# Patient Record
Sex: Female | Born: 1940 | Race: Black or African American | Hispanic: No | Smoking: Former smoker
Health system: Southern US, Community
[De-identification: ages and names within clinical notes are randomized; demographics above are authoritative.]

## PROBLEM LIST (undated history)

## (undated) ENCOUNTER — Ambulatory Visit: Source: Home / Self Care

## (undated) DIAGNOSIS — M5481 Occipital neuralgia: Secondary | ICD-10-CM

## (undated) DIAGNOSIS — D259 Leiomyoma of uterus, unspecified: Secondary | ICD-10-CM

## (undated) DIAGNOSIS — M1990 Unspecified osteoarthritis, unspecified site: Secondary | ICD-10-CM

## (undated) DIAGNOSIS — M8580 Other specified disorders of bone density and structure, unspecified site: Secondary | ICD-10-CM

## (undated) DIAGNOSIS — I10 Essential (primary) hypertension: Secondary | ICD-10-CM

## (undated) HISTORY — DX: Essential (primary) hypertension: I10

## (undated) HISTORY — DX: Unspecified osteoarthritis, unspecified site: M19.90

## (undated) HISTORY — PX: FOOT SURGERY: SHX648

## (undated) HISTORY — DX: Occipital neuralgia: M54.81

## (undated) HISTORY — DX: Leiomyoma of uterus, unspecified: D25.9

## (undated) HISTORY — PX: TUBAL LIGATION: SHX77

## (undated) HISTORY — PX: CATARACT EXTRACTION: SUR2

---
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
## 1997-04-05 ENCOUNTER — Other Ambulatory Visit: Admission: RE | Admit: 1997-04-05 | Discharge: 1997-04-05 | Payer: Self-pay | Admitting: *Deleted

## 1997-04-05 ENCOUNTER — Encounter: Admission: RE | Admit: 1997-04-05 | Discharge: 1997-07-04 | Payer: Self-pay | Admitting: *Deleted

## 1997-04-20 ENCOUNTER — Ambulatory Visit (HOSPITAL_COMMUNITY): Admission: RE | Admit: 1997-04-20 | Discharge: 1997-04-20 | Payer: Self-pay | Admitting: *Deleted

## 1997-06-11 ENCOUNTER — Other Ambulatory Visit: Admission: RE | Admit: 1997-06-11 | Discharge: 1997-06-11 | Payer: Self-pay | Admitting: Gynecology

## 1997-06-14 ENCOUNTER — Ambulatory Visit (HOSPITAL_COMMUNITY): Admission: RE | Admit: 1997-06-14 | Discharge: 1997-06-14 | Payer: Self-pay | Admitting: Obstetrics and Gynecology

## 1999-04-29 ENCOUNTER — Ambulatory Visit (HOSPITAL_COMMUNITY): Admission: RE | Admit: 1999-04-29 | Discharge: 1999-04-29 | Payer: Self-pay | Admitting: Obstetrics and Gynecology

## 1999-04-29 ENCOUNTER — Encounter: Payer: Self-pay | Admitting: Obstetrics and Gynecology

## 1999-11-28 ENCOUNTER — Emergency Department (HOSPITAL_COMMUNITY): Admission: EM | Admit: 1999-11-28 | Discharge: 1999-11-28 | Payer: Self-pay | Admitting: Emergency Medicine

## 1999-11-28 ENCOUNTER — Encounter: Payer: Self-pay | Admitting: Emergency Medicine

## 1999-12-02 ENCOUNTER — Ambulatory Visit (HOSPITAL_COMMUNITY): Admission: RE | Admit: 1999-12-02 | Discharge: 1999-12-02 | Payer: Self-pay | Admitting: Cardiology

## 1999-12-02 ENCOUNTER — Encounter: Payer: Self-pay | Admitting: Cardiology

## 2001-01-24 ENCOUNTER — Other Ambulatory Visit: Admission: RE | Admit: 2001-01-24 | Discharge: 2001-01-24 | Payer: Self-pay | Admitting: Obstetrics and Gynecology

## 2001-01-26 ENCOUNTER — Encounter: Admission: RE | Admit: 2001-01-26 | Discharge: 2001-01-26 | Payer: Self-pay | Admitting: Obstetrics and Gynecology

## 2001-01-26 ENCOUNTER — Encounter: Payer: Self-pay | Admitting: Obstetrics and Gynecology

## 2001-02-01 ENCOUNTER — Encounter: Admission: RE | Admit: 2001-02-01 | Discharge: 2001-02-01 | Payer: Self-pay | Admitting: Obstetrics and Gynecology

## 2001-02-01 ENCOUNTER — Encounter: Payer: Self-pay | Admitting: Obstetrics and Gynecology

## 2001-06-05 ENCOUNTER — Emergency Department (HOSPITAL_COMMUNITY): Admission: EM | Admit: 2001-06-05 | Discharge: 2001-06-05 | Payer: Self-pay | Admitting: Emergency Medicine

## 2002-08-08 ENCOUNTER — Emergency Department (HOSPITAL_COMMUNITY): Admission: EM | Admit: 2002-08-08 | Discharge: 2002-08-08 | Payer: Self-pay | Admitting: *Deleted

## 2002-08-08 ENCOUNTER — Encounter: Payer: Self-pay | Admitting: *Deleted

## 2002-08-23 ENCOUNTER — Encounter: Payer: Self-pay | Admitting: Obstetrics and Gynecology

## 2002-08-23 ENCOUNTER — Ambulatory Visit (HOSPITAL_COMMUNITY): Admission: RE | Admit: 2002-08-23 | Discharge: 2002-08-23 | Payer: Self-pay | Admitting: Obstetrics and Gynecology

## 2002-09-19 ENCOUNTER — Other Ambulatory Visit: Admission: RE | Admit: 2002-09-19 | Discharge: 2002-09-19 | Payer: Self-pay | Admitting: Obstetrics and Gynecology

## 2003-03-10 ENCOUNTER — Emergency Department (HOSPITAL_COMMUNITY): Admission: EM | Admit: 2003-03-10 | Discharge: 2003-03-10 | Payer: Self-pay | Admitting: Emergency Medicine

## 2004-08-26 ENCOUNTER — Ambulatory Visit (HOSPITAL_COMMUNITY): Admission: RE | Admit: 2004-08-26 | Discharge: 2004-08-26 | Payer: Self-pay | Admitting: Obstetrics and Gynecology

## 2004-09-11 ENCOUNTER — Other Ambulatory Visit: Admission: RE | Admit: 2004-09-11 | Discharge: 2004-09-11 | Payer: Self-pay | Admitting: Obstetrics and Gynecology

## 2004-09-27 ENCOUNTER — Emergency Department (HOSPITAL_COMMUNITY): Admission: EM | Admit: 2004-09-27 | Discharge: 2004-09-27 | Payer: Self-pay | Admitting: Emergency Medicine

## 2005-05-24 ENCOUNTER — Emergency Department (HOSPITAL_COMMUNITY): Admission: EM | Admit: 2005-05-24 | Discharge: 2005-05-24 | Payer: Self-pay | Admitting: Family Medicine

## 2005-11-25 ENCOUNTER — Ambulatory Visit (HOSPITAL_COMMUNITY): Admission: RE | Admit: 2005-11-25 | Discharge: 2005-11-25 | Payer: Self-pay | Admitting: Obstetrics and Gynecology

## 2006-01-12 ENCOUNTER — Other Ambulatory Visit: Admission: RE | Admit: 2006-01-12 | Discharge: 2006-01-12 | Payer: Self-pay | Admitting: Obstetrics and Gynecology

## 2007-09-28 ENCOUNTER — Emergency Department (HOSPITAL_COMMUNITY): Admission: EM | Admit: 2007-09-28 | Discharge: 2007-09-28 | Payer: Self-pay | Admitting: Emergency Medicine

## 2008-05-11 ENCOUNTER — Emergency Department (HOSPITAL_COMMUNITY): Admission: EM | Admit: 2008-05-11 | Discharge: 2008-05-11 | Payer: Self-pay | Admitting: Family Medicine

## 2008-06-19 ENCOUNTER — Encounter: Payer: Self-pay | Admitting: Obstetrics and Gynecology

## 2008-06-19 ENCOUNTER — Ambulatory Visit: Payer: Self-pay | Admitting: Obstetrics and Gynecology

## 2008-06-19 ENCOUNTER — Other Ambulatory Visit: Admission: RE | Admit: 2008-06-19 | Discharge: 2008-06-19 | Payer: Self-pay | Admitting: Obstetrics and Gynecology

## 2009-05-02 ENCOUNTER — Ambulatory Visit: Payer: Self-pay | Admitting: Obstetrics and Gynecology

## 2009-05-03 ENCOUNTER — Ambulatory Visit: Payer: Self-pay | Admitting: Obstetrics and Gynecology

## 2009-08-01 ENCOUNTER — Ambulatory Visit (HOSPITAL_COMMUNITY): Admission: RE | Admit: 2009-08-01 | Discharge: 2009-08-01 | Payer: Self-pay | Admitting: Obstetrics and Gynecology

## 2010-04-15 LAB — POCT URINALYSIS DIP (DEVICE)
Nitrite: NEGATIVE
pH: 5.5 (ref 5.0–8.0)

## 2012-11-28 ENCOUNTER — Encounter: Payer: Self-pay | Admitting: Gynecology

## 2012-11-28 ENCOUNTER — Ambulatory Visit (INDEPENDENT_AMBULATORY_CARE_PROVIDER_SITE_OTHER): Payer: Medicare Other | Admitting: Gynecology

## 2012-11-28 VITALS — BP 120/74 | Ht 61.5 in | Wt 176.4 lb

## 2012-11-28 DIAGNOSIS — M8580 Other specified disorders of bone density and structure, unspecified site: Secondary | ICD-10-CM

## 2012-11-28 DIAGNOSIS — N951 Menopausal and female climacteric states: Secondary | ICD-10-CM

## 2012-11-28 DIAGNOSIS — Z780 Asymptomatic menopausal state: Secondary | ICD-10-CM | POA: Insufficient documentation

## 2012-11-28 DIAGNOSIS — N952 Postmenopausal atrophic vaginitis: Secondary | ICD-10-CM | POA: Insufficient documentation

## 2012-11-28 DIAGNOSIS — D259 Leiomyoma of uterus, unspecified: Secondary | ICD-10-CM

## 2012-11-28 DIAGNOSIS — M899 Disorder of bone, unspecified: Secondary | ICD-10-CM

## 2012-11-28 DIAGNOSIS — D219 Benign neoplasm of connective and other soft tissue, unspecified: Secondary | ICD-10-CM

## 2012-11-28 DIAGNOSIS — Z8639 Personal history of other endocrine, nutritional and metabolic disease: Secondary | ICD-10-CM | POA: Insufficient documentation

## 2012-11-28 NOTE — Progress Notes (Addendum)
Miranda Gonzalez [birth year] [id number]   History:    72 y.o. who has not been seen in the office since 2010. Patient with history of fibroid uterus. Last ultrasound 2011 indicated that she had 5 fibroids the largest one measuring 56 x 46 x 49 cm. Patient is sexually active. Has been off hormone replacement therapy for many years. Has not had a colonoscopy yet. Cannot take a flu vaccine because she is allergic to egg white. Patient with known history of osteopenia several years ago had been placed on  Fosomax  but only took it for 6 months. Patient's last bone density study in 2006 demonstrated her lowest T score with -2.1 at the left femoral neck. Last mammogram 2011. Patient with no prior history of abnormal Pap smear. In her 68s she had a history of lichen sclerosis but is asymptomatic today. Her PCP has been treating her for hypertension Past medical history,surgical history, family history and social history were all reviewed and documented in the EPIC chart.  Gynecologic History No LMP recorded. Patient is postmenopausal. Contraception: post menopausal status Last Pap: 2010. Results were: normal Last mammogram: 2011. Results were: normal  Obstetric History OB History  Gravida Para Term Preterm AB SAB TAB Ectopic Multiple Living  4 3   1 1    3     # Outcome Date GA Lbr Len/2nd Weight Sex Delivery Anes PTL Lv  4 SAB           3 PAR           2 PAR           1 PAR                ROS: A ROS was performed and pertinent positives and negatives are included in the history.  GENERAL: No fevers or chills. HEENT: No change in vision, no earache, sore throat or sinus congestion. NECK: No pain or stiffness. CARDIOVASCULAR: No chest pain or pressure. No palpitations. PULMONARY: No shortness of breath, cough or wheeze. GASTROINTESTINAL: No abdominal pain, nausea, vomiting or diarrhea, melena or bright red blood per rectum. GENITOURINARY: No urinary frequency, urgency, hesitancy or dysuria.  MUSCULOSKELETAL: No joint or muscle pain, no back pain, no recent trauma. DERMATOLOGIC: No rash, no itching, no lesions. ENDOCRINE: No polyuria, polydipsia, no heat or cold intolerance. No recent change in weight. HEMATOLOGICAL: No anemia or easy bruising or bleeding. NEUROLOGIC: No headache, seizures, numbness, tingling or weakness. PSYCHIATRIC: No depression, no loss of interest in normal activity or change in sleep pattern.     Exam: chaperone present  BP 120/74  Ht 5' 1.5" (1.562 m)  Wt 176 lb 6.4 oz (80.015 kg)  BMI 32.80 kg/m2  Body mass index is 32.8 kg/(m^2).  General appearance : Well developed well nourished female. No acute distress HEENT: Neck supple, trachea midline, no carotid bruits, no thyroidmegaly Lungs: Clear to auscultation, no rhonchi or wheezes, or rib retractions  Heart: Regular rate and rhythm, no murmurs or gallops Breast:Examined in sitting and supine position were symmetrical in appearance, no palpable masses or tenderness,  no skin retraction, no nipple inversion, no nipple discharge, no skin discoloration, no axillary or supraclavicular lymphadenopathy Abdomen: no palpable masses or tenderness, no rebound or guarding Extremities: no edema or skin discoloration or tenderness  Pelvic:  Bartholin, Urethra, Skene Glands: Within normal limits             Vagina: No gross lesions or discharge,atrophy  Cervix: No gross lesions  or discharge  Uterus  8 week size irregular,   Adnexa  Without masses or tenderness  Anus and perineum  normal   Rectovaginal  normal sphincter tone without palpated masses or tenderness             Hemoccult PCP provides     Assessment/Plan:  72 y.o. female who was reminded to schedule her first colonoscopy. Her PCP has been drawn her blood work. Patient with fibroid uterus will have an ultrasound here in the office in the next 2 weeks. She will return back in the month of January for her bone density study. She was reminded to do her  monthly breast exam. We discussed the importance of calcium and vitamin D and weightbearing exercises for osteoporosis prevention. She was reminded also to schedule her mammogram which is overdue as well. I have given her the name of gastroenterologist for a scheduled colonoscopy. Pap smear was not done today in accordance to the new guidelines.  Note: This dictation was prepared with  Dragon/digital dictation along withSmart phrase technology. Any transcriptional errors that result from this process are unintentional.   Ok Edwards MD, 2:36 PM 11/28/2012

## 2012-11-30 ENCOUNTER — Encounter: Payer: Self-pay | Admitting: Gynecology

## 2012-11-30 ENCOUNTER — Ambulatory Visit (INDEPENDENT_AMBULATORY_CARE_PROVIDER_SITE_OTHER): Payer: Medicare Other | Admitting: Gynecology

## 2012-11-30 DIAGNOSIS — K644 Residual hemorrhoidal skin tags: Secondary | ICD-10-CM | POA: Insufficient documentation

## 2012-11-30 NOTE — Progress Notes (Signed)
Patient was seen in the office on November 24 her annual gynecological examination. She presented to the office today that stating that after the rectal exam she felt mucousy like rectal discharge no blood and some fleshy like material near her anus. She states her bowel movements have been regular and she is noted no blood on the toilet paper when wiping.  Patient underwent an anoscopic exam whereby she was placed in the knee-chest position and a lubricated endoscope was introduced which demonstrated a small fleshy and external nonthrombosed hemorrhoids and a smaller one internally located.  Assessment/plan: 2 small hemorrhoids not thrombosed 1 external one internal. Patient asymptomatic otherwise having normal bowel movements was reassured and she can use Preparation H when necessary. Patient has never had a colonoscopy and was counseled again on the importance of it being scheduled with a gastroenterologist.

## 2012-11-30 NOTE — Patient Instructions (Signed)
Hemorrhoids Hemorrhoids are swollen veins around the rectum or anus. There are two types of hemorrhoids:   Internal hemorrhoids. These occur in the veins just inside the rectum. They may poke through to the outside and become irritated and painful.  External hemorrhoids. These occur in the veins outside the anus and can be felt as a painful swelling or hard lump near the anus. CAUSES  Pregnancy.   Obesity.   Constipation or diarrhea.   Straining to have a bowel movement.   Sitting for long periods on the toilet.  Heavy lifting or other activity that caused you to strain.  Anal intercourse. SYMPTOMS   Pain.   Anal itching or irritation.   Rectal bleeding.   Fecal leakage.   Anal swelling.   One or more lumps around the anus.  DIAGNOSIS  Your caregiver may be able to diagnose hemorrhoids by visual examination. Other examinations or tests that may be performed include:   Examination of the rectal area with a gloved hand (digital rectal exam).   Examination of anal canal using a small tube (scope).   A blood test if you have lost a significant amount of blood.  A test to look inside the colon (sigmoidoscopy or colonoscopy). TREATMENT Most hemorrhoids can be treated at home. However, if symptoms do not seem to be getting better or if you have a lot of rectal bleeding, your caregiver may perform a procedure to help make the hemorrhoids get smaller or remove them completely. Possible treatments include:   Placing a rubber band at the base of the hemorrhoid to cut off the circulation (rubber band ligation).   Injecting a chemical to shrink the hemorrhoid (sclerotherapy).   Using a tool to burn the hemorrhoid (infrared light therapy).   Surgically removing the hemorrhoid (hemorrhoidectomy).   Stapling the hemorrhoid to block blood flow to the tissue (hemorrhoid stapling).  HOME CARE INSTRUCTIONS   Eat foods with fiber, such as whole grains, beans,  nuts, fruits, and vegetables. Ask your doctor about taking products with added fiber in them (fibersupplements).  Increase fluid intake. Drink enough water and fluids to keep your urine clear or pale yellow.   Exercise regularly.   Go to the bathroom when you have the urge to have a bowel movement. Do not wait.   Avoid straining to have bowel movements.   Keep the anal area dry and clean. Use wet toilet paper or moist towelettes after a bowel movement.   Medicated creams and suppositories may be used or applied as directed.   Only take over-the-counter or prescription medicines as directed by your caregiver.   Take warm sitz baths for 15 20 minutes, 3 4 times a day to ease pain and discomfort.   Place ice packs on the hemorrhoids if they are tender and swollen. Using ice packs between sitz baths may be helpful.   Put ice in a plastic bag.   Place a towel between your skin and the bag.   Leave the ice on for 15 20 minutes, 3 4 times a day.   Do not use a donut-shaped pillow or sit on the toilet for long periods. This increases blood pooling and pain.  SEEK MEDICAL CARE IF:  You have increasing pain and swelling that is not controlled by treatment or medicine.  You have uncontrolled bleeding.  You have difficulty or you are unable to have a bowel movement.  You have pain or inflammation outside the area of the hemorrhoids. MAKE SURE YOU:    Understand these instructions.  Will watch your condition.  Will get help right away if you are not doing well or get worse. Document Released: 12/20/1999 Document Revised: 12/09/2011 Document Reviewed: 10/27/2011 [employer] Patient Information 2014 [employer].  

## 2012-12-07 ENCOUNTER — Encounter: Payer: Self-pay | Admitting: Obstetrics and Gynecology

## 2012-12-10 ENCOUNTER — Emergency Department (INDEPENDENT_AMBULATORY_CARE_PROVIDER_SITE_OTHER): Payer: 59

## 2012-12-10 ENCOUNTER — Emergency Department (HOSPITAL_COMMUNITY)
Admission: EM | Admit: 2012-12-10 | Discharge: 2012-12-10 | Disposition: A | Discharge status: Home / Self Care | Payer: 59 | Source: Home / Self Care | Attending: Emergency Medicine | Admitting: Emergency Medicine

## 2012-12-10 ENCOUNTER — Encounter (HOSPITAL_COMMUNITY): Payer: Self-pay | Admitting: Emergency Medicine

## 2012-12-10 DIAGNOSIS — S92919B Unspecified fracture of unspecified toe(s), initial encounter for open fracture: Secondary | ICD-10-CM

## 2012-12-10 DIAGNOSIS — S92911B Unspecified fracture of right toe(s), initial encounter for open fracture: Secondary | ICD-10-CM

## 2012-12-10 NOTE — ED Provider Notes (Signed)
CSN: [id number]     Arrival date & time 12/10/12  1243 History   First MD Initiated Contact with Patient 12/10/12 1446     Chief Complaint  Patient presents with  . Toe Injury    Patient is a 72 y.o. female presenting with toe pain. The history is provided by the patient.  Toe Pain This is a new problem. The current episode started more than 2 days ago. The problem occurs constantly. The problem has not changed since onset.The symptoms are aggravated by walking. Nothing relieves the symptoms. She has tried rest for the symptoms.  Pt reports she "stumped her (R) 3rd toe on a cement step on Tuesday while attempting to go up the steps and missed the step. Since that time she has buddy taped the toe but has not noted much improvement. Pt concerned she may have broken the toe.   Past Medical History  Diagnosis Date  . Hypertension    Past Surgical History  Procedure Laterality Date  . Tubal ligation    . Foot surgery Bilateral    Family History  Problem Relation Age of Onset  . Heart disease Mother   . Hypertension Mother   . Diabetes Father   . Heart disease Father   . Diabetes Sister   . Heart disease Sister   . Hypertension Sister   . Breast cancer Sister   . Diabetes Brother    History  Substance Use Topics  . Smoking status: Former Games developer  . Smokeless tobacco: Never Used  . Alcohol Use: Yes     Comment: occasional   OB History   Grav Para Term Preterm Abortions TAB SAB Ect Mult Living   4 3   1  1   3      Review of Systems  All other systems reviewed and are negative.    Allergies  Phenergan  Home Medications   Current Outpatient Rx  Name  Route  Sig  Dispense  Refill  . Ascorbic Acid (VITAMIN C PO)   Oral   Take by mouth.         Marland Kitchen atenolol (TENORMIN) 50 MG tablet   Oral   Take 50 mg by mouth 2 (two) times daily.         . Calcium Carbonate-Vitamin D (CALTRATE 600+D) 600-400 MG-UNIT per tablet   Oral   Take 1 tablet by mouth daily.         .  cholecalciferol (VITAMIN D) 1000 UNITS tablet   Oral   Take 1,000 Units by mouth daily.         Marland Kitchen MAGNESIUM PO   Oral   Take by mouth.         . olmesartan (BENICAR) 20 MG tablet   Oral   Take 20 mg by mouth daily.         Marland Kitchen VITAMIN E PO   Oral   Take by mouth.          BP 137/80  Pulse 62  Temp(Src) 98.4 F (36.9 C) (Oral)  Resp 16  SpO2 96% Physical Exam  Nursing note and vitals reviewed. Constitutional: She is oriented to person, place, and time. She appears well-developed and well-nourished. No distress.  HENT:  Head: Normocephalic and atraumatic.  Eyes: Conjunctivae are normal.  Cardiovascular: Normal rate.   Pulmonary/Chest: Effort normal.  Musculoskeletal:       Feet:  Persistent swelling and TTP to (R) 3rd toe. No obvious deformity, no open wounds.  Neurological: She is alert and oriented to person, place, and time.  Skin: Skin is warm and dry.  Psychiatric: She has a normal mood and affect.    ED Course  Procedures (including critical care time) Labs Review Labs Reviewed - No data to display Imaging Review Dg Foot Complete Right  12/10/2012   CLINICAL DATA:  Injury of the 3rd digit.  Four days ago.  EXAM: RIGHT FOOT COMPLETE - 3+ VIEW  COMPARISON:  None.  FINDINGS: The patient has undergone previous surgery involving the distal aspect of the 5th metatarsal as well as distal aspect of 1st metatarsal and the midshaft of the proximal phalanx of the great toe. The patient has sustained an acute fracture of the midshaft of the middle phalanx of the 3rd toe. The proximal and distal phalanges are grossly intact. The 3rd metatarsal appears intact. There is chronic deformity at the 2nd PIP joint with lateral subluxation of the middle phalanx with respect to the proximal phalanx. There are chronic changes associated with the PIP joint of the 4th toe chronic lateral subluxation of the middle phalanx. The hindfoot exhibits no acute abnormality.  IMPRESSION: 1. The  patient has sustained an acute minimally displaced transversely oriented fracture of the midshaft of the middle phalanx of the right 3rd toe. 2. There are chronic deformities centered at the PIP joints of the 2nd and 4th toes. 3. There are postsurgical changes associated with 1st and 5th rays.   Electronically Signed   By: David  [place]   On: 12/10/2012 14:54    EKG Interpretation    Date/Time:    Ventricular Rate:    PR Interval:    QRS Duration:   QT Interval:    QTC Calculation:   R Axis:     Text Interpretation:              MDM  No diagnosis found.  Injury to (R) 3rd toe Tuesday that has had persistent TTP and swelling despite buddy taping the toe since injury. XR shows small minimally displaced transverse fx of midshaft of mid-phalanx of (R) 3rd toe. No open wounds. Re-buddy taped and placed (R) foot in post-op boot for protection and support. Pt declined anything stronger for pain. Is to arrange f/u w/ her orthopedists if not improving over the next several days.    Leanne Chang, NP 12/10/12 [phone]

## 2012-12-10 NOTE — ED Notes (Signed)
C/O tripping up a stair step 5 days ago; c/o continued right 3rd toe pain.  Has been using buddy tape without relief.  Right 2nd toe is deformed - states it is from previous surgery.

## 2012-12-10 NOTE — ED Provider Notes (Signed)
Medical screening examination/treatment/procedure(s) were performed by non-physician practitioner and as supervising physician I was immediately available for consultation/collaboration.  Leslee Home, M.D.  Reuben Likes, MD 12/10/12 [phone]

## 2012-12-19 ENCOUNTER — Ambulatory Visit (INDEPENDENT_AMBULATORY_CARE_PROVIDER_SITE_OTHER): Payer: Medicare Other

## 2012-12-19 ENCOUNTER — Ambulatory Visit (INDEPENDENT_AMBULATORY_CARE_PROVIDER_SITE_OTHER): Payer: Medicare Other | Admitting: Gynecology

## 2012-12-19 ENCOUNTER — Other Ambulatory Visit: Payer: Self-pay | Admitting: Gynecology

## 2012-12-19 DIAGNOSIS — D259 Leiomyoma of uterus, unspecified: Secondary | ICD-10-CM

## 2012-12-19 DIAGNOSIS — N852 Hypertrophy of uterus: Secondary | ICD-10-CM

## 2012-12-19 DIAGNOSIS — N83339 Acquired atrophy of ovary and fallopian tube, unspecified side: Secondary | ICD-10-CM

## 2012-12-19 DIAGNOSIS — D251 Intramural leiomyoma of uterus: Secondary | ICD-10-CM

## 2012-12-19 DIAGNOSIS — D219 Benign neoplasm of connective and other soft tissue, unspecified: Secondary | ICD-10-CM

## 2012-12-19 DIAGNOSIS — D252 Subserosal leiomyoma of uterus: Secondary | ICD-10-CM

## 2012-12-19 NOTE — Progress Notes (Signed)
The patient is a 72 year old who was seen for the first time since 2010 on 11/28/2012. Patient with past history of fibroid uterus and review of her record and indicated that her last ultrasound had been performed in 2011 and she had approximately 5 fibroids with the largest one measuring 56 x 46 x 49 cm. The patient is sexually active. She has been off of hormone replacement therapy for many years. She denies any vaginal bleeding or any pelvic pain. She was asked to return today for a follow up ultrasound to monitor the size to compare with previous scan of 3 years ago.  Ultrasound today: Uterus measured 10.1 x 11.1 x 7.2 cm she was noted to have benign fibroids the largest one now measuring 68 x 56 x 60 mm. Endometrial stripe was 2.6 mm.  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 We had discussed that increasing size of fibroid uterus in a postmenopausal patient is concerning in the event of a leiomyosarcoma. We'll schedule for laparoscopic assisted vaginal hysterectomy with bilateral salpingo-oophorectomy in March. She will be seen the week before for ultrasound and for preop consultation. She is being followed by her cardiologist Dr. Algie Coffer for hypertension and palpitations and we will need to have a medical clearance before her surgery. She has informed that she has an appointment to see him once every the 19th. We will send him a copy of this office note.  Literature and information was provided.

## 2012-12-19 NOTE — Patient Instructions (Signed)
Uterine Fibroid A uterine fibroid is a growth (tumor) that occurs in a woman's uterus. This type of tumor is not cancerous and does not spread out of the uterus. A woman can have one or many fibroids, and the fiboid(s) can become quite large. A fibroid can vary in size, weight, and where it grows in the uterus. Most fibroids do not require medical treatment, but some can cause pain or heavy bleeding during and between periods. CAUSES  A fibroid is the result of a single uterine cell that keeps growing (unregulated), which is different than most cells in the human body. Most cells have a control mechanism that keeps them from reproducing without control.  SYMPTOMS   Bleeding.  Pelvic pain and pressure.  Bladder problems due to the size of the fibroid.  Infertility and miscarriages depending on the size and location of the fibroid. DIAGNOSIS  A diagnosis is made by physical exam. Your caregiver may feel the lumpy tumors during a pelvic exam. Important information regarding size, location, and number of tumors can be gained by having an ultrasound. It is rare that other tests, such as a CT scan or MRI, are needed. TREATMENT   Your caregiver may recommend watchful waiting. This involves getting the fibroid checked by your caregiver to see if the fibroids grow or shrink.   Hormonal treatment or an intrauterine device (IUD) may be prescribed.   Surgery may be needed to remove the fibroids (myomectomy) or the uterus (hysterectomy). This depends on your situation. When fibroids interfere with fertility and a woman wants to become pregnant, a caregiver may recommend having the fibroids removed.  HOME CARE INSTRUCTIONS  Home care depends on how you were treated. In general:   Keep all follow-up appointments with your caregiver.   Only take medicine as told by your caregiver. Do not take aspirin. It can cause bleeding.   If you have excessive periods and soak tampons or pads in a half hour or  less, contact your caregiver immediately. If your periods are troublesome but not so heavy, lie down with your feet raised slightly above your heart. Place cold packs on your lower abdomen.   If your periods are heavy, write down the number of pads or tampons you use per month. Bring this information to your caregiver.   Talk to your caregiver about taking iron pills.   Include green vegetables in your diet.   If you were prescribed a hormonal treatment, take the hormonal medicines as directed.   If you need surgery, ask your caregiver for information on your specific surgery.  SEEK IMMEDIATE MEDICAL CARE IF:  You have pelvic pain or cramps not controlled with medicines.   You have a sudden increase in pelvic pain.   You have an increase of bleeding between and during periods.   You feel lightheaded or have fainting episodes.  MAKE SURE YOU:  Understand these instructions.  Will watch your condition.  Will get help right away if you are not doing well or get worse. Document Released: 12/20/1999 Document Revised: 03/16/2011 Document Reviewed: 07/21/2012 [facility] Patient Information 2014 [place], [place]. Laparoscopically Assisted Vaginal Hysterectomy  A laparoscopically assisted vaginal hysterectomy (LAVH) is a surgical procedure to remove the uterus and cervix, and sometimes the ovaries and fallopian tubes. During an LAVH, some of the surgical removal is done through the vagina, and the rest is done through a few small surgical cuts (incisions) in the abdomen.  This procedure is usually considered in women when a  vaginal hysterectomy is not an option. Your health care provider will discuss the risks and benefits of the different surgical techniques at your appointment. Generally, recovery time is faster and there are fewer complications after laparoscopic procedures than after open incisional procedures. LET [facility] CARE PROVIDER KNOW ABOUT:   Any allergies you  have.  All medicines you are taking, including vitamins, herbs, eye drops, creams, and over-the-counter medicines.  Previous problems you or members of your family have had with the use of anesthetics.  Any blood disorders you have.  Previous surgeries you have had.  Medical conditions you have. RISKS AND COMPLICATIONS Generally, this is a safe procedure. However, as with any procedure, complications can occur. Possible complications include:  Allergies to medicines.  Difficulty breathing.  Bleeding.  Infection.  Damage to other structures near your uterus and cervix. BEFORE THE PROCEDURE  Ask your health care provider about changing or stopping your regular medicines.  Take certain medicines, such as a colon-emptying preparation, as directed.  Do not eat or drink anything for at least 8 hours before your surgery.  Stop smoking if you smoke. Stopping will improve your health after surgery.  Arrange for a ride home after surgery and for help at home during recovery. PROCEDURE   An IV tube will be put into one of your veins in order to give you fluids and medicines.  You will receive medicines to relax you and medicines that make you sleep (general anesthetic).  You may have a flexible tube (catheter) put into your bladder to drain urine.  You may have a tube put through your nose or mouth that goes into your stomach (nasogastric tube). The nasogastric tube removes digestive fluids and prevents you from feeling nauseated and from vomiting.  Tight-fitting (compression) stockings will be placed on your legs to promote circulation.  Three to four small incisions will be made in your abdomen. An incision also will be made in your vagina. Probes and tools will be inserted into the small incisions. The uterus and cervix are removed (and possibly your ovaries and fallopian tubes) through your vagina as well as through the small incisions that were made in the abdomen.  Your  vagina is then sewn back to normal. AFTER THE PROCEDURE  You may have a liquid diet temporarily. You will most likely return to, and tolerate, your usual diet the day after surgery.  You will be passing urine through a catheter. It will be removed the day after surgery.  Your temperature, breathing rate, heart rate, blood pressure, and oxygen level will be monitored regularly.  You will still wear compression stockings on your legs until you are able to move around.  You will use a special device or do breathing exercises to keep your lungs clear.  You will be encouraged to walk as soon as possible. Document Released: 12/11/2010 Document Revised: 08/24/2012 Document Reviewed: 07/07/2012 [facility] Patient Information 2014 [place], [place].

## 2013-01-20 ENCOUNTER — Telehealth: Payer: Self-pay

## 2013-01-20 NOTE — Telephone Encounter (Signed)
I left patient a message to call me as I am holding her chart for surgery in march and I now have the march scheduled.

## 2013-03-29 ENCOUNTER — Telehealth: Payer: Self-pay | Admitting: *Deleted

## 2013-03-29 ENCOUNTER — Telehealth: Payer: Self-pay

## 2013-03-29 DIAGNOSIS — D259 Leiomyoma of uterus, unspecified: Secondary | ICD-10-CM

## 2013-03-29 NOTE — Telephone Encounter (Signed)
I called patient to follow up as while back Dr. Moshe Salisbury had sent me her chart to schedule LAVH,BSO for late March. I had contacted her and let her know March schedule was open but i never heard from her. I called her back today and she said that she does think she wants to schedule but not until she comes back for another exam. She wants to schedule that for early May.  I will wait to hear from Dr. Moshe Salisbury after that visit.

## 2013-03-29 NOTE — Telephone Encounter (Signed)
Order placed, donna informed 

## 2013-03-29 NOTE — Telephone Encounter (Signed)
We will comply with her wishes and redress again after ultrasound.

## 2013-03-29 NOTE — Telephone Encounter (Signed)
Pt was suppose to schedule surgery LAVH for March, pt chose to decline surgery for now. Pt she does want the follow up ultrasound which she scheduled on 05/10/13. Is this okay?

## 2013-05-10 ENCOUNTER — Ambulatory Visit (INDEPENDENT_AMBULATORY_CARE_PROVIDER_SITE_OTHER): Payer: Medicare Other

## 2013-05-10 ENCOUNTER — Other Ambulatory Visit: Payer: Self-pay | Admitting: Gynecology

## 2013-05-10 ENCOUNTER — Ambulatory Visit (INDEPENDENT_AMBULATORY_CARE_PROVIDER_SITE_OTHER): Payer: Medicare Other | Admitting: Gynecology

## 2013-05-10 DIAGNOSIS — D259 Leiomyoma of uterus, unspecified: Secondary | ICD-10-CM

## 2013-05-10 DIAGNOSIS — N852 Hypertrophy of uterus: Secondary | ICD-10-CM

## 2013-05-10 DIAGNOSIS — N83339 Acquired atrophy of ovary and fallopian tube, unspecified side: Secondary | ICD-10-CM

## 2013-05-10 NOTE — Progress Notes (Signed)
   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 Patientwas seen for the first time since 2010 on 11/28/2012. Patient with past history of fibroid uterus and review of her record and indicated that her last ultrasound had been performed in 2011 and she had approximately 5 fibroids with the largest one measuring 56 x 46 x 49 cm. The patient is sexually active. She has been off of hormone replacement therapy for many years. She denies any vaginal bleeding or any pelvic pain. She was asked to return today for a follow up ultrasound to monitor the size to compare with previous scan of 3 years ago.  Ultrasound on 12/19/2012: Uterus measured 10.1 x 11.1 x 7.2 cm she was noted to have benign fibroids the largest one now measuring 68 x 56 x 60 mm. Endometrial stripe was 2.6 mm.  Ultrasound today:  Uterus measured 10.9 x 11.2 x 6.5 cm. Multiple fibroids totaling 7 with the largest one measuring 68 x 45 mm and calcified. 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

## 2013-08-21 ENCOUNTER — Ambulatory Visit: Payer: Medicare Other | Admitting: Podiatry

## 2013-08-28 ENCOUNTER — Ambulatory Visit: Payer: Self-pay

## 2013-08-28 ENCOUNTER — Encounter: Payer: Self-pay | Admitting: Podiatry

## 2013-08-28 ENCOUNTER — Ambulatory Visit (INDEPENDENT_AMBULATORY_CARE_PROVIDER_SITE_OTHER): Payer: Medicare Other | Admitting: Podiatry

## 2013-08-28 ENCOUNTER — Other Ambulatory Visit: Payer: Self-pay | Admitting: Podiatry

## 2013-08-28 ENCOUNTER — Ambulatory Visit (INDEPENDENT_AMBULATORY_CARE_PROVIDER_SITE_OTHER): Payer: Medicare Other

## 2013-08-28 DIAGNOSIS — M79672 Pain in left foot: Secondary | ICD-10-CM

## 2013-08-28 DIAGNOSIS — M216X9 Other acquired deformities of unspecified foot: Secondary | ICD-10-CM

## 2013-08-28 DIAGNOSIS — M79671 Pain in right foot: Secondary | ICD-10-CM

## 2013-08-28 DIAGNOSIS — M79609 Pain in unspecified limb: Secondary | ICD-10-CM

## 2013-08-28 DIAGNOSIS — M2040 Other hammer toe(s) (acquired), unspecified foot: Secondary | ICD-10-CM

## 2013-08-28 NOTE — Progress Notes (Signed)
   Subjective:    Patient ID: Miranda Gonzalez, female    DOB: 1940/01/27, 73 y.o.   MRN: [id number]  HPI Comments: "I have pain in my toes"  Patient c/o aching 2nd and 3rd toes right foot for several years. She had previous surgery and has noticed her 2nd toe starting to shift. The 3rd toe is being aggravated by the 2nd toe laying on top of it. Shoes are uncomfortable. She has swelling a lot. Any recommendations?  Foot Pain      Review of Systems  All other systems reviewed and are negative.      Objective:   Physical Exam        Assessment & Plan:

## 2013-08-29 NOTE — Progress Notes (Signed)
Subjective:     Patient ID: Miranda Gonzalez, female   DOB: 10-23-1940, 73 y.o.   MRN: [id number]  Foot Pain   patient presents after a number of years with displaced second and third toes right that she states become bothersome and some structural deformity of the left that is not bothersome but she was concerned about. States that the toes filled better and a healed shoe than a flat shoe but they gradually the pain has worsened over the last 20 years since surgery   Review of Systems  All other systems reviewed and are negative.      Objective:   Physical Exam  Nursing note and vitals reviewed. Constitutional: She is oriented to person, place, and time.  Cardiovascular: Intact distal pulses.   Musculoskeletal: Normal range of motion.  Neurological: She is oriented to person, place, and time.  Skin: Skin is warm.   neurovascular status found to be intact with muscle strength adequate and range of motion of the subtalar and midtarsal joint within normal limits. Patient is found to have significant digital contracture of the second and third toes on the right foot with frontal and transverse plane abnormalities and sagittal plane deformity of the second toe with contracture of the extensor tendon at the MPJ. On the left foot there is moderate hallux varus deformity that occurred over the years but it is reducible with no arthritis of the joint surface noted. 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 I do think we can attempt digital fusion of the second and third toe along with shortening of the second metatarsal with screw fixation and release of the extensor tendon and possible MPJ with hopefully  improvement of her condition. I did explain this is a difficult problem due to the long-term nature of the deformity and whether we can get significant correction or complete correction is debatable. She does want to get this done at one point but cannot do it right now and we'll check her schedule and decide what appropriate. She does have diabetes but is under control with diet and she would like to lose another 15-20 pounds before she has to surgery

## 2013-11-06 ENCOUNTER — Encounter: Payer: Self-pay | Admitting: Podiatry

## 2014-01-15 ENCOUNTER — Emergency Department (HOSPITAL_COMMUNITY)
Admission: EM | Admit: 2014-01-15 | Discharge: 2014-01-15 | Disposition: A | Discharge status: Home / Self Care | Payer: Medicare Other | Source: Home / Self Care | Attending: Emergency Medicine | Admitting: Emergency Medicine

## 2014-01-15 ENCOUNTER — Encounter (HOSPITAL_COMMUNITY): Payer: Self-pay

## 2014-01-15 DIAGNOSIS — M5481 Occipital neuralgia: Secondary | ICD-10-CM

## 2014-01-15 MED ORDER — GABAPENTIN 100 MG PO CAPS: ORAL_CAPSULE | ORAL | Status: DC

## 2014-01-15 NOTE — ED Provider Notes (Signed)
Chief Complaint   Hair/Scalp Problem   History of Present Illness   Miranda Gonzalez is a 74 year old female who has had a 9 day history of a headache on the left side of her head. The patient states she's had a sensitive spot on her scalp in the left occipital area for years. This would hurt when she would comb her hair. She denies any injury to the area and has not felt any sort of lesion in that area. 9 days ago she began to have pain that radiated from this spot to the entire left side of the head, and down into her neck. It would hurt to move or to touch her head and she can feel a small raised area. She denies any rash or blisters. The pain is sharp and stabbing. It comes and goes and can last for seconds to minutes at a time. She denies any jaw claudication or visual changes. No diplopia or blurry vision. No fever, chills, or stiff neck. She denies any trauma or use of anticoagulants. She's had no eye pain, neck pain or injury, or history of DVT or thrombophlebitis.  Review of Systems   Other than as noted above, the patient denies any of the following symptoms: Systemic:  No fever, chills, photophobia, or stiff neck. Eye:  No blurred vision, or diplopia. ENT:  No nasal congestion, rhinorrhea, sinus pressure or pain, or sore throat.  No jaw claudication. Neuro:  No paresthesias, loss of consciousness, seizure activity, muscle weakness, trouble with coordination or gait, trouble speaking or swallowing. Psych:  No depression, anxiety or trouble sleeping.  [place]   Past medical history, family history, social history, meds, and allergies were reviewed.    Physical Examination    Vital signs:  BP 138/87 mmHg  Pulse 70  Temp(Src) 98.1 F (36.7 C)  Resp 16  SpO2 93% General:  Alert and oriented.  In no distress. Eye:  Lids and conjunctivas normal.  PERRL,  Full EOMs.  Fundi benign with normal discs and vessels. There was no papilledema.  ENT:  There is a dime-sized area that's  very tender to touch in her left occipital area. There is no rash in this area, no swelling, no erythema, and no vesicles.  TMs and canals clear.  Nasal mucosa was normal and uncongested without any drainage. No intra oral lesions, pharynx clear, mucous membranes moist, dentition normal. Neck:  Supple, full ROM, no tenderness to palpation.  No adenopathy or mass. Neuro:  Alert and orented times 3.  Speech was clear, fluent, and appropriate.  Cranial nerves intact. No pronator drift, muscle strength normal. Finger to nose normal.  DTRs were 2+ and symmetrical.Station and gait were normal.  Romberg's sign was normal.  Able to perform tandem gait well. Psych:  Normal affect.   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   This seems to be a neuritis or neuralgia. There is no evidence of temporal arteritis. This could be a manifestation of herpes zoster, although no skin lesions are evident.  There is no evidence of subarachnoid hemorrhage, meningitis, encephalitis, subdural hematoma, epidural hematoma, acute glaucoma, carotid dissection, temporal arteritis, cavernous sinus thrombosis, carbon monoxide toxicity, or pseudotumor cerebri.    Plan   1.  Meds:  The following meds were prescribed:   Discharge Medication List as of 01/15/2014 10:24 AM    START taking these medications   Details  gabapentin (NEURONTIN) 100 MG capsule Take 1 daily for 3  days, then 1 BID for 3 days, then 1 TID thereafter., Normal         2.  Patient Education/Counseling:  The patient was given appropriate handouts, self care instructions, and instructed in pain control.    3.  Follow up:  The patient was told to follow up here if no better in 2 to 3 days, or sooner if becoming worse in any way, and given some red flag symptoms such as fever, worsening pain, persistent vomiting, or new neurological symptoms which would prompt immediate return.  Follow-up with her primary care physician  within the next week.     Harden Mo, MD 01/15/14 1537

## 2014-01-15 NOTE — ED Notes (Signed)
C/o pain in her scalp and neck since 01-07-14

## 2014-01-15 NOTE — Discharge Instructions (Signed)
Occipital Neuralgia Occipital neuralgia is a type of headache that causes episodes of very bad pain in the back of your head. Pain from occipital neuralgia may spread (radiate) to other parts of your head. The pain is usually brief and often goes away after you rest and relax. These headaches may be caused by irritation of the nerves that leave your spinal cord high up in your neck, just below the base of your skull (occipital nerves). Your occipital nerves transmit sensations from the back of your head, the top of your head, and the areas behind your ears. CAUSES Occipital neuralgia can occur without any known cause (primary headache syndrome). In other cases, occipital neuralgia is caused by pressure on or irritation of one of the two occipital nerves. Causes of occipital nerve compression or irritation include:  Wear and tear of the vertebrae in the neck (osteoarthritis).  Neck injury.  Disease of the disks that separate the vertebrae.  Tumors.  Gout.  Infections.  Diabetes.  Swollen blood vessels that put pressure on the occipital nerves.  Muscle spasm in the neck. SIGNS AND SYMPTOMS Pain is the main symptom of occipital neuralgia. It usually starts in the back of the head but may also be felt in other areas supplied by the occipital nerves. Pain is usually on one side but may be on both sides. You may have:   Brief episodes of very bad pain that is burning, stabbing, shocking, or shooting.  Pain behind the eye.  Pain triggered by neck movement or hair brushing.  Scalp tenderness.  Aching in the back of the head between episodes of very bad pain. DIAGNOSIS  Your health care provider may diagnose occipital neuralgia based on your symptoms and a physical exam. During the exam, the health care provider may push on areas supplied by the occipital nerves to see if they are painful. Some tests may also be done to help in making the diagnosis. These may include:  Imaging studies of  the upper spinal cord, such as an MRI or CT scan. These may show compression or spinal cord abnormalities.  Nerve block. You will get an injection of numbing medicine (local anesthetic) near the occipital nerve to see if this relieves pain. TREATMENT  Treatment may begin with simple measures, such as:   Rest.  Massage.  Heat.  Over-the-counter pain relievers. If these measures do not work, you may need other treatments, including:  Medicines such as:  Prescription-strength anti-inflammatory medicines.  Muscle relaxants.  Antiseizure medicines.  Antidepressants.  Steroid injection. This involves injections of local anesthetic and strong anti-inflammatory drugs (steroids).  Pulsed radiofrequency. Wires are implanted to deliver electrical impulses that block pain signals from the occipital nerve.  Physical therapy.  Surgery to relieve nerve pressure. HOME CARE INSTRUCTIONS  Take all medicines as directed by your health care provider.  Avoid activities that cause pain.  Rest when you have an attack of pain.  Try gentle massage or a heating pad to relieve pain.  Work with a physical therapist to learn stretching exercises you can do at home.  Try a different pillow or sleeping position.  Practice good posture.  Try to stay active. Get regular exercise that does not cause pain. Ask your health care provider to suggest safe exercises for you.  Keep all follow-up visits as directed by your health care provider. This is important. SEEK MEDICAL CARE IF:  Your medicine is not working.  You have new or worsening symptoms. SEEK IMMEDIATE MEDICAL CARE   IF:  You have very bad head pain that is not going away.  You have a sudden change in vision, balance, or speech. MAKE SURE YOU:  Understand these instructions.  Will watch your condition.  Will get help right away if you are not doing well or get worse. Document Released: 12/16/2000 Document Revised: 05/08/2013  Document Reviewed: 12/14/2012 [employer] Patient Information 2015 [employer]. This information is not intended to replace advice given to you by your health care provider. Make sure you discuss any questions you have with your health care provider.  

## 2014-01-25 ENCOUNTER — Encounter: Payer: Medicare Other | Admitting: Gynecology

## 2014-02-05 ENCOUNTER — Ambulatory Visit (INDEPENDENT_AMBULATORY_CARE_PROVIDER_SITE_OTHER): Payer: Medicare Other | Admitting: Gynecology

## 2014-02-05 ENCOUNTER — Encounter: Payer: Self-pay | Admitting: Gynecology

## 2014-02-05 VITALS — BP 128/82 | Ht 62.0 in | Wt 168.0 lb

## 2014-02-05 DIAGNOSIS — D251 Intramural leiomyoma of uterus: Secondary | ICD-10-CM

## 2014-02-05 DIAGNOSIS — Z01419 Encounter for gynecological examination (general) (routine) without abnormal findings: Secondary | ICD-10-CM

## 2014-02-05 DIAGNOSIS — M8580 Other specified disorders of bone density and structure, unspecified site: Secondary | ICD-10-CM

## 2014-02-05 DIAGNOSIS — Z8639 Personal history of other endocrine, nutritional and metabolic disease: Secondary | ICD-10-CM

## 2014-02-05 NOTE — Progress Notes (Signed)
Miranda Gonzalez June 19, 1940 [id number]   History:    74 y.o.  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 She is very active and exercises and recently has been doing kickboxing as well. Patient with known history of fibroid uterus. Ultrasound reports as follows:  Ultrasound on 12/19/2012: Uterus measured 10.1 x 11.1 x 7.2 cm she was noted to have benign fibroids the largest one now measuring 68 x 56 x 60 mm. Endometrial stripe was 2.6 mm.  Uterus measured 10.9 x 11.2 x 6.5 cm. Multiple fibroids totaling 7 with the largest one measuring 68 x 45 mm and calcified. Right ovary atrophic left ovary not seen no apparent left adnexal masses no fluid in the cul-de-sac  Patient denies any vaginal bleeding and is on no hormone replacement therapy and is sexually active. Patient has declined colonoscopy. Her vaccines are all up-to-date. She is overdue for mammogram as well as a bone density study. She does have history of osteopenia. Several years ago she had been placed on Fosamax until good for 6 months but discontinued secondary to side effects. Patient will overdue for bone density study last study on record 2006 with the lowest T score -2.1 at the left femoral neck. No comparison study available and no  Frax analysis done at another facility.  Past medical history,surgical history, family history and social history were all reviewed and documented in the EPIC chart.  Gynecologic History No LMP recorded. Patient is postmenopausal. Contraception: post menopausal status Last Pap: 2010. Results were: normal Last mammogram: 2014. Results were: normal  Obstetric History OB History  Gravida Para Term Preterm AB SAB TAB Ectopic Multiple Living  4 3   1 1    3     # Outcome Date GA Lbr Len/2nd Weight Sex Delivery Anes PTL Lv  4 SAB           3 Para           2 Para           1 Para                 ROS: A ROS was performed and pertinent positives and negatives are included in the history.  GENERAL: No fevers or chills. HEENT: No change in vision, no earache, sore throat or sinus congestion. NECK: No pain or stiffness. CARDIOVASCULAR: No chest pain or pressure. No palpitations. PULMONARY: No shortness of breath, cough or wheeze. GASTROINTESTINAL: No abdominal pain, nausea, vomiting or diarrhea, melena or bright red blood per rectum. GENITOURINARY: No urinary frequency, urgency, hesitancy or dysuria. MUSCULOSKELETAL: No joint or muscle pain, no back pain, no recent trauma. DERMATOLOGIC: No rash, no itching, no lesions. ENDOCRINE: No polyuria, polydipsia, no heat or cold intolerance. No recent change in weight. HEMATOLOGICAL: No anemia or easy bruising or bleeding. NEUROLOGIC: No headache, seizures, numbness, tingling or weakness. PSYCHIATRIC: No depression, no loss of interest in normal activity or change in sleep pattern.     Exam: chaperone present  BP 128/82 mmHg  Ht 5\' 2"  (1.575 m)  Wt 168 lb (76.204 kg)  BMI 30.72 kg/m2  Body mass index is 30.72 kg/(m^2).  General appearance : Well developed well nourished female. No acute distress HEENT: Neck supple, trachea midline, no carotid bruits, no thyroidmegaly Lungs: Clear to auscultation, no rhonchi or wheezes, or rib retractions  Heart: Regular rate and rhythm, no murmurs  or gallops Breast:Examined in sitting and supine position were symmetrical in appearance, no palpable masses or tenderness,  no skin retraction, no nipple inversion, no nipple discharge, no skin discoloration, no axillary or supraclavicular lymphadenopathy Abdomen: no palpable masses or tenderness, no rebound or guarding Extremities: no edema or skin discoloration or tenderness  Pelvic:  Bartholin, Urethra, Skene Glands: Within normal limits             Vagina: No gross lesions or discharge, vaginal atrophy  Cervix: No gross lesions or discharge  Uterus  10  week size irregular nontender  Adnexa  Without masses or tenderness  Anus and perineum  normal   Rectovaginal  normal sphincter tone without palpated masses or tenderness             Hemoccult PCP provides     Assessment/Plan:  74 y.o. female for annual exam with known history of fibroid uterus. Patient would like to follow-up with the ultrasound this month instead of waiting to complete a full year. It was 8 months ago with her last ultrasound was done. Pap smear no longer needed. Patient was given a requisition to schedule her mammogram and bone density study which both overdue. Her PCP has been doing her blood work. All her vaccines are up-to-date. We discussed importance of calcium vitamin D and regular exercise for osteoporosis prevention.   Terrance Mass MD, 3:14 PM 02/05/2014

## 2014-02-05 NOTE — Patient Instructions (Signed)
Colonoscopy A colonoscopy is an exam to look at the entire large intestine (colon). This exam can help find problems such as tumors, polyps, inflammation, and areas of bleeding. The exam takes about 1 hour.  LET [facility] CARE PROVIDER KNOW ABOUT:   Any allergies you have.  All medicines you are taking, including vitamins, herbs, eye drops, creams, and over-the-counter medicines.  Previous problems you or members of your family have had with the use of anesthetics.  Any blood disorders you have.  Previous surgeries you have had.  Medical conditions you have. RISKS AND COMPLICATIONS  Generally, this is a safe procedure. However, as with any procedure, complications can occur. Possible complications include:  Bleeding.  Tearing or rupture of the colon wall.  Reaction to medicines given during the exam.  Infection (rare). BEFORE THE PROCEDURE   Ask your health care provider about changing or stopping your regular medicines.  You may be prescribed an oral bowel prep. This involves drinking a large amount of medicated liquid, starting the day before your procedure. The liquid will cause you to have multiple loose stools until your stool is almost clear or light green. This cleans out your colon in preparation for the procedure.  Do not eat or drink anything else once you have started the bowel prep, unless your health care provider tells you it is safe to do so.  Arrange for someone to drive you home after the procedure. PROCEDURE   You will be given medicine to help you relax (sedative).  You will lie on your side with your knees bent.  A long, flexible tube with a light and camera on the end (colonoscope) will be inserted through the rectum and into the colon. The camera sends video back to a computer screen as it moves through the colon. The colonoscope also releases carbon dioxide gas to inflate the colon. This helps your health care provider see the area better.  During  the exam, your health care provider may take a small tissue sample (biopsy) to be examined under a microscope if any abnormalities are found.  The exam is finished when the entire colon has been viewed. AFTER THE PROCEDURE   Do not drive for 24 hours after the exam.  You may have a small amount of blood in your stool.  You may pass moderate amounts of gas and have mild abdominal cramping or bloating. This is caused by the gas used to inflate your colon during the exam.  Ask when your test results will be ready and how you will get your results. Make sure you get your test results. Document Released: 12/20/1999 Document Revised: 10/12/2012 Document Reviewed: 08/29/2012 [employer] Patient Information 2015 [place], [place]. This information is not intended to replace advice given to you by your health care provider. Make sure you discuss any questions you have with your health care provider. Bone Densitometry Bone densitometry is a special X-ray that measures your bone density and can be used to help predict your risk of bone fractures. This test is used to determine bone mineral content and density to diagnose osteoporosis. Osteoporosis is the loss of bone that may cause the bone to become weak. Osteoporosis commonly occurs in women entering menopause. However, it may be found in men and in people with other diseases. PREPARATION FOR TEST No preparation necessary. WHO SHOULD BE TESTED?  All women older than 64.  Postmenopausal women (50 to 11) with risk factors for osteoporosis.  People with a previous fracture caused  by normal activities.  People with a small body frame (less than 127 poundsor a body mass index [BMI] of less than 21).  People who have a parent with a hip fracture or history of osteoporosis.  People who smoke.  People who have rheumatoid arthritis.  Anyone who engages in excessive alcohol use (more than 3 drinks most days).  Women who experience early menopause. WHEN  SHOULD YOU BE RETESTED? Current guidelines suggest that you should wait at least 2 years before doing a bone density test again if your first test was normal.Recent studies indicated that women with normal bone density may be able to wait a few years before needing to repeat a bone density test. You should discuss this with your caregiver.  NORMAL FINDINGS   Normal: less than standard deviation below normal (greater than -1).  Osteopenia: 1 to 2.5 standard deviations below normal (-1 to -2.5).  Osteoporosis: greater than 2.5 standard deviations below normal (less than -2.5). Test results are reported as a "T score" and a "Z score."The T score is a number that compares your bone density with the bone density of healthy, young women.The Z score is a number that compares your bone density with the scores of women who are the same age, gender, and race.  Ranges for normal findings may vary among different laboratories and hospitals. You should always check with your doctor after having lab work or other tests done to discuss the meaning of your test results and whether your values are considered within normal limits. MEANING OF TEST  Your caregiver will go over the test results with you and discuss the importance and meaning of your results, as well as treatment options and the need for additional tests if necessary. OBTAINING THE TEST RESULTS It is your responsibility to obtain your test results. Ask the lab or department performing the test when and how you will get your results. Document Released: 01/14/2004 Document Revised: 03/16/2011 Document Reviewed: 02/05/2010 [facility] Patient Information 2015 [place], [place]. This information is not intended to replace advice given to you by your health care provider. Make sure you discuss any questions you have with your health care provider.

## 2014-02-21 ENCOUNTER — Encounter: Payer: Self-pay | Admitting: Neurology

## 2014-02-21 ENCOUNTER — Ambulatory Visit (INDEPENDENT_AMBULATORY_CARE_PROVIDER_SITE_OTHER): Payer: Medicare Other | Admitting: Neurology

## 2014-02-21 VITALS — BP 128/64 | HR 62 | Temp 98.7°F | Resp 18 | Ht 62.0 in | Wt 167.4 lb

## 2014-02-21 DIAGNOSIS — M5481 Occipital neuralgia: Secondary | ICD-10-CM

## 2014-02-21 DIAGNOSIS — M542 Cervicalgia: Secondary | ICD-10-CM

## 2014-02-21 NOTE — Patient Instructions (Signed)
I think you have occipital neuralgia which is causing tension and neck pain.  We can see how physical therapy does for you.  Follow up in 6 weeks.  If ineffective, we can consider nerve block or MRI of cervical spine

## 2014-02-21 NOTE — Progress Notes (Signed)
NEUROLOGY CONSULTATION NOTE  Miranda Gonzalez MRN: [id number] DOB: 1940-11-19  Referring provider: Dr. Sharlet Salina Primary care provider: Dr. Doylene Canard  Reason for consult:  Neck and posterior head pain.  HISTORY OF PRESENT ILLNESS: Miranda Gonzalez is a 74 year old ambidextrous woman with hypertension and diet controlled diabetes who presents for neck pain.  Records reviewed.  In 2004, she developed a pain in the back of her head, where the pins of her bun touched the scalp.  She stopped wearing her hair in a bun but there remained a sensitive spot in that area, on the left side of the back of the head.  About 2 months ago, she was brushing her hair when she developed a severe pain from that area.  She describes it as a searing numbing pain that shoots over her left ear to the left temporal region.  She then developed tension and pain in the left side of her neck.  The head pain is triggered when she moves her head to either side.  Neck pain is better after swimming.  She denies pain and numbness down the arm and hand.  She denies weakness or visual disturbance.  She went to the chiropractor who diagnosed her with occipital neuralgia.  She was prescribed gabapentin, but did not start it because she does not like taking medications.  PAST MEDICAL HISTORY: Past Medical History  Diagnosis Date  . Hypertension   . Occipital neuralgia     PAST SURGICAL HISTORY: Past Surgical History  Procedure Laterality Date  . Tubal ligation    . Foot surgery Bilateral     MEDICATIONS: Current Outpatient Prescriptions on File Prior to Visit  Medication Sig Dispense Refill  . amLODipine (NORVASC) 5 MG tablet Take 5 mg by mouth daily.    . Ascorbic Acid (VITAMIN C PO) Take by mouth.    Marland Kitchen atenolol (TENORMIN) 50 MG tablet Take 50 mg by mouth 2 (two) times daily.    . Calcium Carbonate-Vitamin D (CALTRATE 600+D) 600-400 MG-UNIT per tablet Take 1 tablet by mouth daily.    . cholecalciferol (VITAMIN D) 1000  UNITS tablet Take 1,000 Units by mouth daily.    . hydrochlorothiazide (HYDRODIURIL) 25 MG tablet Take 25 mg by mouth daily.    Marland Kitchen MAGNESIUM PO Take by mouth.    . Potassium 75 MG TABS Take by mouth.    . gabapentin (NEURONTIN) 100 MG capsule Take 1 daily for 3 days, then 1 BID for 3 days, then 1 TID thereafter. (Patient not taking: Reported on 02/05/2014) 90 capsule 0  . olmesartan (BENICAR) 20 MG tablet Take 20 mg by mouth daily.    Marland Kitchen VITAMIN E PO Take by mouth.     No current facility-administered medications on file prior to visit.    ALLERGIES: Allergies  Allergen Reactions  . 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 Phenergan [Promethazine Hcl]     FAMILY HISTORY: Family History  Problem Relation Age of Onset  . Heart disease Mother   . Hypertension Mother   . Diabetes Father   . Heart disease Father   . Diabetes Sister   . Heart disease Sister   . Hypertension Sister   . Breast cancer Sister   . Diabetes Brother   . Thyroid disease Daughter   . Migraines Daughter   . Cancer Maternal Grandmother     breast     SOCIAL HISTORY: History   Social  History  . Marital Status: Divorced    Spouse Name: N/A  . Number of Children: N/A  . Years of Education: N/A   Occupational History  . Not on file.   Social History Main Topics  . Smoking status: Former Research scientist (life sciences)  . Smokeless tobacco: Never Used  . Alcohol Use: 0.0 oz/week    0 Standard drinks or equivalent per week     Comment: occasional  . Drug Use: No  . Sexual Activity:    Partners: Male   Other Topics Concern  . Not on file   Social History Narrative    REVIEW OF SYSTEMS: Constitutional: No fevers, chills, or sweats, no generalized fatigue, change in appetite Eyes: No visual changes, double vision, eye pain Ear, nose and throat: No hearing loss, ear pain, nasal congestion, sore throat Cardiovascular: No chest pain, palpitations Respiratory:  No shortness of  breath at rest or with exertion, wheezes GastrointestinaI: No nausea, vomiting, diarrhea, abdominal pain, fecal incontinence Genitourinary:  No dysuria, urinary retention or frequency Musculoskeletal:  Neck pain Integumentary: No rash, pruritus, skin lesions Neurological: as above Psychiatric: No depression, insomnia, anxiety Endocrine: No palpitations, fatigue, diaphoresis, mood swings, change in appetite, change in weight, increased thirst Hematologic/Lymphatic:  No anemia, purpura, petechiae. Allergic/Immunologic: no itchy/runny eyes, nasal congestion, recent allergic reactions, rashes  PHYSICAL EXAM: Filed Vitals:   02/21/14 0928  BP: 128/64  Pulse: 62  Temp: 98.7 F (37.1 C)  Resp: 18   General: No acute distress Head:  Normocephalic/atraumatic Eyes:  fundi unremarkable, without vessel changes, exudates, hemorrhages or papilledema. Neck: supple, tenderness to the left suboccipital region and left side of her neck to palpation.  Reduced range of motion to all directions. Back: No paraspinal tenderness Heart: regular rate and rhythm Lungs: Clear to auscultation bilaterally. Vascular: No carotid bruits. Neurological Exam: Mental status: alert and oriented to person, place, and time, recent and remote memory intact, fund of knowledge intact, attention and concentration intact, speech fluent and not dysarthric, language intact. Cranial nerves: CN I: not tested CN II: pupils equal, round and reactive to light, visual fields intact, fundi unremarkable, without vessel changes, exudates, hemorrhages or papilledema. CN III, IV, VI:  full range of motion, no nystagmus, no ptosis CN V: facial sensation intact CN VII: upper and lower face symmetric CN VIII: hearing intact CN IX, X: gag intact, uvula midline CN XI: sternocleidomastoid and trapezius muscles intact CN XII: tongue midline Bulk & Tone: normal, no fasciculations. Motor:  5/5 throughout Sensation:  Pinprick and vibration  intact Deep Tendon Reflexes:  2+ throughout, toes downgoing Finger to nose testing:  No dysmetria Heel to shin:  No dysmetria Gait:  Normal station and stride.  Able to turn and walk in tandem. Romberg negative.  IMPRESSION: Left-sided occipital neuralgia Left sided neck pain.  It could be related to muscle spasms from pain in the back of the head.  We discussed occipital nerve block.  She would prefer a more "holistic" therapy at this time.  PLAN: 1.  We will try a round of PT for the neck 2.  She will follow up in 6 weeks.  If PT is ineffective, she would be willing to consider occipital nerve block and muscle relaxants.  MRI of cervical spine would be consideration too.  45 minutes spent with patient, over 50% spent discussing diagnosis and management options.  Thank you for allowing me to take part in the care of this patient.  Metta Clines, DO  CC:  Arvella Nigh,  DC  Dixie Dials, MD

## 2014-02-26 ENCOUNTER — Telehealth: Payer: Self-pay | Admitting: *Deleted

## 2014-02-26 DIAGNOSIS — Z1231 Encounter for screening mammogram for malignant neoplasm of breast: Secondary | ICD-10-CM

## 2014-02-26 DIAGNOSIS — M8580 Other specified disorders of bone density and structure, unspecified site: Secondary | ICD-10-CM

## 2014-02-26 NOTE — Telephone Encounter (Signed)
Pt called requesting order for mammogram and bone density at breast center. Order placed pt will contact breast center to schedule.

## 2014-02-28 ENCOUNTER — Ambulatory Visit (INDEPENDENT_AMBULATORY_CARE_PROVIDER_SITE_OTHER): Payer: Medicare Other

## 2014-02-28 ENCOUNTER — Ambulatory Visit (INDEPENDENT_AMBULATORY_CARE_PROVIDER_SITE_OTHER): Payer: Medicare Other | Admitting: Gynecology

## 2014-02-28 ENCOUNTER — Other Ambulatory Visit: Payer: Self-pay | Admitting: Gynecology

## 2014-02-28 ENCOUNTER — Encounter: Payer: Self-pay | Admitting: Gynecology

## 2014-02-28 VITALS — BP 128/80

## 2014-02-28 DIAGNOSIS — D251 Intramural leiomyoma of uterus: Secondary | ICD-10-CM

## 2014-02-28 DIAGNOSIS — R938 Abnormal findings on diagnostic imaging of other specified body structures: Secondary | ICD-10-CM

## 2014-02-28 DIAGNOSIS — R9389 Abnormal findings on diagnostic imaging of other specified body structures: Secondary | ICD-10-CM

## 2014-02-28 DIAGNOSIS — D252 Subserosal leiomyoma of uterus: Secondary | ICD-10-CM

## 2014-02-28 DIAGNOSIS — N83319 Acquired atrophy of ovary, unspecified side: Secondary | ICD-10-CM

## 2014-02-28 DIAGNOSIS — N8331 Acquired atrophy of ovary: Secondary | ICD-10-CM

## 2014-02-28 NOTE — Progress Notes (Signed)
   74 year old patient presented to the office today to discuss her ultrasound report. She was seen in the office on 02/05/2014 for annual exam. Patient with known past history of leiomyomatous uteri was here to discuss results and compare with previous study of 2014.  Ultrasound on 12/19/2012: Uterus measured 10.1 x 11.1 x 7.2 cm she was noted to have benign fibroids the largest one now measuring 68 x 56 x 60 mm. Endometrial stripe was 2.6 mm.  Ultrasound today: Uterus measured 10.0 x 9.3 x 6.9 cm with endometrial stripe of 6.2 mm. Patient was stable intramural and subserous fibroids with the following dimensions: 4.3 x 3.2 cm, 4.2 x 3.3 cm calcified, midline 2.8 cm fibroid, 3.0 x 2.4 cm, 2.9 x 2.6 and meter fibroid also left ovarian fibroid measured 6.7 x 4.6 cm and calcified. Prominent endometrial cavity negative color flow. Right and left ovary atrophic no fluid in the cul-de-sac no change in fibroids from previous scan.  Assessment/plan: We discussed today greater than 50% of the time on the results of her fibroid uterus. I discussed with the patient's stability on the fibroids. I discussed with the do endometrial stripe was above the cut off a 5 mm and a post menopausal patient but since she is on no hormone replacement therapy is having no vaginal bleeding and endometrial biopsy is not warranted at this time. If she experience is any bleeding she'll return to the office and we will follow up with endometrial biopsy. She was reminded to follow-up with her primary physician for blood work as well as with her cardiologist. We will see her back in one year or when necessary. She was also reminded to schedule her overdue bone density study and mammogram.

## 2014-03-05 ENCOUNTER — Ambulatory Visit
Admission: RE | Admit: 2014-03-05 | Discharge: 2014-03-05 | Disposition: A | Discharge status: Home / Self Care | Payer: Medicare Other | Source: Ambulatory Visit | Attending: Gynecology | Admitting: Gynecology

## 2014-03-05 DIAGNOSIS — Z1231 Encounter for screening mammogram for malignant neoplasm of breast: Secondary | ICD-10-CM

## 2014-03-05 DIAGNOSIS — M8580 Other specified disorders of bone density and structure, unspecified site: Secondary | ICD-10-CM

## 2014-03-13 ENCOUNTER — Ambulatory Visit: Payer: Medicare Other | Attending: Neurology | Admitting: Rehabilitative and Restorative Service Providers"

## 2014-03-13 DIAGNOSIS — M6281 Muscle weakness (generalized): Secondary | ICD-10-CM

## 2014-03-13 DIAGNOSIS — M542 Cervicalgia: Secondary | ICD-10-CM | POA: Diagnosis present

## 2014-03-13 NOTE — Therapy (Signed)
[facility] [address] [place], [place], [address] Phone: [phone]   Fax:  [phone]  Physical Therapy Evaluation  Patient Details  Name: Miranda Gonzalez MRN: [id number] Date of Birth: [birth year] Referring Provider:  Dixie Dials, MD  Encounter Date: 03/13/2014      PT End of Session - 03/13/14 1437    Visit Number 1   Number of Visits 8   Date for PT Re-Evaluation 04/20/14   PT Start Time 1020   PT Stop Time 1100   PT Time Calculation (min) 40 min   Activity Tolerance Patient tolerated treatment well   Behavior During Therapy [facility] for tasks assessed/performed      Past Medical History  Diagnosis Date  . Hypertension   . Occipital neuralgia     Past Surgical History  Procedure Laterality Date  . Tubal ligation    . Foot surgery Bilateral     There were no vitals taken for this visit.  Visit Diagnosis:  Neck pain  Muscle weakness      Subjective Assessment - 03/13/14 1028    Symptoms The patient reports sudden onset of L scalp pain when brushing her hair and she noted difficulty moving the next morning.  She also awoke with intense headache.  Initial onset lasted 4 days and she was referred to neurology.  Pain was improved by Advil.  The pain has improved moderately since onset.  She notes trouble sleeping on her left side and a radiating headache that moves from L occipit to top of her head.  Headache is worse at night and notes more frequent if sleeping on the right side.  She has h/o L shoulder pain that is isolated from her neck.   Patient Stated Goals Decrease neck pain and discomfort.     Currently in Pain? 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  No   Is the patient reluctant to leave their home because of a fear of falling?  No   Home Environment   Living Enviornment Private residence   Observation/Other Assessments   Focus on Therapeutic Outcomes (FOTO)  41%   Other Surveys  --  neck disability index 32%   Sensation   Light Touch Appears Intact   Posture/Postural Control   Posture/Postural Control Postural limitations   Postural Limitations Rounded Shoulders;Forward head;Decreased lumbar lordosis  elevated scapula with tightness, wears heels-knees flexed   ROM / Strength   AROM / PROM / Strength AROM;Strength  AROM   AROM Assessment Site Cervical;Shoulder   Right/Left Shoulder --  WFLs for flex/abd/ER/IR with minimal L shoulder pain   Cervical Flexion WNLs   Cervical Extension WNLs   Cervical - Right Side Bend 22 deg   Cervical - Left Side Bend 28 deg   Cervical - Right Rotation 45 deg   Cervical - Left Rotation 25 deg   Strength   Right/Left Shoulder Right;Left   Right Shoulder Flexion 3/5   Right Shoulder Extension --  h/o R biccipital tear   Right Shoulder ABduction 3/5   Left Shoulder Flexion 4/5   Left Shoulder ABduction 5/5   Right/Left Elbow --  4/5 R elbow flexion, 5/5 L elbow flexion, 5/5 bilat elbowext   Palpation   Palpation Significant tightness over bilateral scalenes, bilateral upper trapezius, and parascapular musculature.  The patient is guarded with light touch to  suboccipital region on the left side.                          PT Education - 03/13/14 1437    Education provided Yes   Education Details HEP: shoulder roll and diagonal neck stretch looking to shoulder   Person(s) Educated Patient   Methods Explanation;Demonstration;Handout   Comprehension Verbalized understanding;Returned demonstration             PT Long Term Goals - 03/14/14 1434    PT LONG TERM GOAL #1   Title The patient will return demo HEP for neck stretching and postural stabilization independently.  Target date 04/20/2014   Time 4   Period Weeks   PT LONG TERM GOAL #2   Title The patient will decrease NDI by 10% (from 32%).  Target date 04/20/2014   Time 4   Period Weeks   PT LONG TERM GOAL #3   Title The patient will improve L rotation from 25 deg to 40 deg for improved functional mobility.  Target date 04/20/2014   Time 4   Period Weeks   PT LONG TERM GOAL #4   Title The patient will improve bilateral sidebending to 30 deg (from R 22 deg/ L 28 deg).  Target date 04/20/2014   Time 4   Period Weeks   PT LONG TERM GOAL #5   Title The patient will report pain < or equal to 2/10 at rest.  TArget date 04/20/2014   Time 4   Period Weeks               Plan - 03/14/14 1437    Clinical Impression Statement The patient is a 74 yo female with neck pain presenting to PT with decreased ROM and muscle stiffness.     Pt will benefit from skilled therapeutic intervention in order to improve on the following deficits Decreased range of motion;Impaired flexibility;Improper body mechanics;Postural dysfunction;Increased muscle spasms;Decreased mobility;Decreased strength;Pain   Rehab Potential Good   PT Frequency 2x / week   PT Duration 4 weeks   PT Treatment/Interventions Moist Heat;Cryotherapy;Therapeutic activities;Patient/family education;Manual techniques;Therapeutic exercise;Passive range of motion;Neuromuscular re-education   PT Next Visit Plan  Establish further HEP for flexibility and postural stabilization.   Consulted and Agree with Plan of Care Patient         Problem List Patient Active Problem List   Diagnosis Date Noted  . External hemorrhoid 11/30/2012  . Fibroids 11/28/2012  . Vaginal atrophy 11/28/2012  . H/O vitamin D deficiency 11/28/2012  . Osteopenia 11/28/2012  . Menopause 11/28/2012  Thank you for the referral of this patient.   [place], [place] 03/14/2014, 2:39 PM  [facility] [address] [place], [place], [address] Phone: [phone]   Fax:  [phone]

## 2014-03-13 NOTE — Patient Instructions (Signed)
Healthy Back - Shoulder Roll   Stand straight with arms relaxed at sides. Roll shoulders backward continuously. Do __10__ times.  Do as needed to relieve tightness. This exercise can also be done one shoulder at a time.  Copyright  VHI. All rights reserved.   Neck Diagonal - Arms at Sides   Standing facing forward, arms at sides, turn head halfway to one side and move head diagonally looking towards your shoulder.  Repeat on other side. Do _10___ repetitions.   http://bt.exer.us/255   Copyright  VHI. All rights reserved.

## 2014-03-20 ENCOUNTER — Ambulatory Visit: Payer: Medicare Other

## 2014-03-20 DIAGNOSIS — M6281 Muscle weakness (generalized): Secondary | ICD-10-CM

## 2014-03-20 DIAGNOSIS — M542 Cervicalgia: Secondary | ICD-10-CM

## 2014-03-20 NOTE — Therapy (Signed)
[facility] [address] [place], [place], [address] Phone: [phone]   Fax:  [phone]  Physical Therapy Treatment  Patient Details  Name: Miranda Gonzalez MRN: [id number] Date of Birth: 29-Oct-1940 Referring Provider:  Dixie Dials, MD  Encounter Date: 03/20/2014      PT End of Session - 03/20/14 1123    Visit Number 2   Number of Visits 8   Date for PT Re-Evaluation 04/20/14   Authorization Type [employer]-no visit limit   PT Start Time 0934   PT Stop Time 1015   PT Time Calculation (min) 41 min      Past Medical History  Diagnosis Date  . Hypertension   . Occipital neuralgia     Past Surgical History  Procedure Laterality Date  . Tubal ligation    . Foot surgery Bilateral     There were no vitals filed for this visit.  Visit Diagnosis:  Neck pain  Muscle weakness      Subjective Assessment - 03/20/14 0937    Symptoms Pain has been worse since last night. Pt believes she may have slept on her left side which tends to irritate it. However it had been improving with the exercises during the few days following her previous session.   Currently in Pain? Yes   Pain Score 6    Pain Location Neck   Pain Orientation Left   Pain Descriptors / Indicators Penetrating  catching   Pain Radiating Towards head/scalp on left side        The patient was taught, performed, and was provided with a home exercise program to address neck flexibility and strength. This included cervical retraction, upper trapezius stretch each side, and scapular retraction. See pt instructions for details.  Also observed pt as she performed bicep curls as she has been doing this as home. Pt uses a 5# weight at home and this appears to be too much weight for the LUE as there is compensatory muscle activation noted as well as scapular instability. Pt was recommended to downgrade to 3# with this exercise and perform more slowly in front  of a mirror to ensure correct form. She verbalized and demonstrated understanding.  Also recommended pt perform occipital passive stretch/distrction with towel roll in supine before going to sleep at night. Pt performed this during session for 3 minutes and reported increased comfort.  Manual therapy: Soft tissue massage to suboccipitals, and left lateral portion of neck: scalenes, sternocleidomastoid and trapezius with pt especially guarded of posterior scalene palpation/massage. Also provided gentle, grade 2 passive distraction of the occiput, and briefly provided distraction with sidebending.                       PT Education - 03/20/14 1122    Education provided Yes   Education Details HEP: cervical retraction, upper traps stretch, scapular squeezes, towel roll for distraction of occiput   Person(s) Educated Patient   Methods Explanation;Demonstration;Handout   Comprehension Verbalized understanding;Returned demonstration             PT Long Term Goals - 03/14/14 1434    PT LONG TERM GOAL #1   Title The patient will return demo HEP for neck stretching and postural stabilization independently.  Target date 04/20/2014   Time 4   Period Weeks   PT LONG TERM GOAL #2   Title The patient will decrease NDI by 10% (from 32%).  Target date 04/20/2014   Time  4   Period Weeks   PT LONG TERM GOAL #3   Title The patient will improve L rotation from 25 deg to 40 deg for improved functional mobility.  Target date 04/20/2014   Time 4   Period Weeks   PT LONG TERM GOAL #4   Title The patient will improve bilateral sidebending to 30 deg (from R 22 deg/ L 28 deg).  Target date 04/20/2014   Time 4   Period Weeks   PT LONG TERM GOAL #5   Title The patient will report pain < or equal to 2/10 at rest.  TArget date 04/20/2014   Time 4   Period Weeks               Plan - 03/20/14 1124    Clinical Impression Statement Pt's pain seems to be improving with exercises  provided last session and exercises were added today with pt tolerating them well. She also has shoulder instability which is likely contributing to her neck pain as well. Pt expected to continue to benefit from skilled PT services to address these deficits.   PT Next Visit Plan continued manual therapy for suboccipitals, scalp, scalenes in private room so pt can remove hairpiece for improved access to neck. Continue with stretching and add isometric strength if tolerable   Consulted and Agree with Plan of Care Patient        Problem List Patient Active Problem List   Diagnosis Date Noted  . External hemorrhoid 11/30/2012  . Fibroids 11/28/2012  . Vaginal atrophy 11/28/2012  . H/O vitamin D deficiency 11/28/2012  . Osteopenia 11/28/2012  . Menopause 11/28/2012   Delrae Sawyers, PT,DPT,NCS 03/20/2014 11:35 AM Phone [phone] FAX [phone]         [facility] [address] [place], [place], [address] Phone: [phone]   Fax:  [phone]

## 2014-03-20 NOTE — Patient Instructions (Addendum)
Shoulder Blade Squeeze   Rotate shoulders back, then squeeze shoulder blades together. Repeat 20 times. Do 2 sessions per day.  http://gt2.exer.us/847   Copyright  VHI. All rights reserved.    Flexibility: Upper Trapezius Stretch   Gently grasp right side of head while sitting on the palm of your other hand. Tilt head away until a gentle stretch is felt. Hold 30 seconds. Repeat 3 times per set on each side.  Do 2 sessions per day.  http://orth.exer.us/341   Copyright  VHI. All rights reserved.    Axial Extension (Chin Tuck)   Pull chin in and lengthen back of neck. Hold 5 seconds while counting out loud. Repeat 20 times. Do 2 sessions per day.  http://gt2.exer.us/450   Copyright  VHI. All rights reserved.

## 2014-03-22 ENCOUNTER — Ambulatory Visit: Payer: Medicare Other | Admitting: Rehabilitative and Restorative Service Providers"

## 2014-03-26 ENCOUNTER — Ambulatory Visit: Payer: Medicare Other

## 2014-03-26 DIAGNOSIS — M542 Cervicalgia: Secondary | ICD-10-CM

## 2014-03-26 DIAGNOSIS — M6281 Muscle weakness (generalized): Secondary | ICD-10-CM

## 2014-03-26 NOTE — Therapy (Signed)
[facility] [address] [place], [place], [address] Phone: [phone]   Fax:  [phone]  Physical Therapy Treatment  Patient Details  Name: Miranda Gonzalez MRN: [id number] Date of Birth: 1940/10/30 Referring Provider:  Dixie Dials, MD  Encounter Date: 03/26/2014      PT End of Session - 03/26/14 1638    Visit Number 3   Number of Visits 8   Date for PT Re-Evaluation 04/20/14   Authorization Type UHC-no visit limit   PT Start Time 1532   PT Stop Time 5956   PT Time Calculation (min) 62 min      Past Medical History  Diagnosis Date  . Hypertension   . Occipital neuralgia     Past Surgical History  Procedure Laterality Date  . Tubal ligation    . Foot surgery Bilateral     There were no vitals filed for this visit.  Visit Diagnosis:  Neck pain  Muscle weakness      Subjective Assessment - 03/26/14 1538    Symptoms (p) Pt reports she had two good nights of sleep following her previous therapy session. Having some of the radiating pain on the left side of neck today.   Currently in Pain? (p) Yes   Pain Score (p) 5    Pain Location (p) Neck   Pain Orientation (p) Left       Manual therapy- Soft tissue massage to pt's suboccipitals,  Scalenes, upper trapezius mucles, myofascial release of scalp, trigger point release of suboccipitals.   Self care -Palpation of neck nodule. See clinical impression statement for thorough conversation with patient. Recommended pt see a dermatologist to get opinion/recommendations for the nodule that seems to be compressing her nerve. Also discussed PT plan of care and what we can improve (muscle tension); versus not improve (the nodule). Recommended pt try to allow freer movement of the neck as nothing will compress that nerve unless she is combing her hair, laying down, or washing her hair or wearing a hairpiece. Recommended limiting unnecessary compression on the  nodule.                             PT Long Term Goals - 03/14/14 1434    PT LONG TERM GOAL #1   Title The patient will return demo HEP for neck stretching and postural stabilization independently.  Target date 04/20/2014   Time 4   Period Weeks   PT LONG TERM GOAL #2   Title The patient will decrease NDI by 10% (from 32%).  Target date 04/20/2014   Time 4   Period Weeks   PT LONG TERM GOAL #3   Title The patient will improve L rotation from 25 deg to 40 deg for improved functional mobility.  Target date 04/20/2014   Time 4   Period Weeks   PT LONG TERM GOAL #4   Title The patient will improve bilateral sidebending to 30 deg (from R 22 deg/ L 28 deg).  Target date 04/20/2014   Time 4   Period Weeks   PT LONG TERM GOAL #5   Title The patient will report pain < or equal to 2/10 at rest.  TArget date 04/20/2014   Time 4   Period Weeks               Plan - 03/26/14 1639    Clinical Impression Statement Pt has a small round nodule  on the left side of her neck, superior to her occiput.  The nodule feels free, not attached to anything, but when pressure is applied there, it causes extreme pain and reproduces much of the patient's radiating pain. Pt is very guarded of this small spot. Pt provided a very thorough history of her pain today which included her discovery of this nodule about 10 years ago, and at that time, pressure on the nodule caused an "odd" sensation, but no additional pain. In January of this year, while brushing her hair with a new, firm brush, she hit the nodule quite hard and it sent a very sharp pain through her neck and face. She reported being fearful that she had cancer. Also being concerned about what it may be and she saw a chiropractor and two physicians to seek answers. It was diagnosed as occipital neuralgia. It seems that this small nodule was the original cause of her nerve pain, and with time she has been more guarded, fearful and has  developed stiffness as a result. The stiffness is causing additional pain. I recommended pt ask a dermatologist about the nodule and determine if anything can be done for it. In addition, we will continue treating the secondary neck stiffness and pain that has developed.    PT Next Visit Plan continued manual therapy for suboccipitals, and scalenes. Avoid the nodule on neck. If pt feels decreased pain x1 week, begin more stretngthening of neck.        Problem List Patient Active Problem List   Diagnosis Date Noted  . External hemorrhoid 11/30/2012  . Fibroids 11/28/2012  . Vaginal atrophy 11/28/2012  . H/O vitamin D deficiency 11/28/2012  . Osteopenia 11/28/2012  . Menopause 11/28/2012   Delrae Sawyers, PT,DPT,NCS 03/26/2014 4:54 PM Phone [phone] FAX [phone]         [facility] [address] [place], [place], [address] Phone: [phone]   Fax:  [phone]

## 2014-03-28 ENCOUNTER — Ambulatory Visit: Payer: Medicare Other

## 2014-03-28 DIAGNOSIS — M542 Cervicalgia: Secondary | ICD-10-CM | POA: Diagnosis not present

## 2014-03-28 DIAGNOSIS — M6281 Muscle weakness (generalized): Secondary | ICD-10-CM

## 2014-03-28 NOTE — Therapy (Signed)
[facility] [address] [place], [place], [address] Phone: [phone]   Fax:  [phone]  Physical Therapy Treatment  Patient Details  Name: Miranda Gonzalez MRN: [id number] Date of Birth: Jul 14, 1940 Referring Provider:  Dixie Dials, MD  Encounter Date: 03/28/2014      PT End of Session - 03/28/14 1201    Visit Number 4   Number of Visits 8   Date for PT Re-Evaluation 04/20/14   Authorization Type UHC-no visit limit   PT Start Time 1017   PT Stop Time 1102   PT Time Calculation (min) 45 min      Past Medical History  Diagnosis Date  . Hypertension   . Occipital neuralgia     Past Surgical History  Procedure Laterality Date  . Tubal ligation    . Foot surgery Bilateral     There were no vitals filed for this visit.  Visit Diagnosis:  Neck pain  Muscle weakness  Therex -neck extensor stretch 3x30 seconds with overpressure --neck sidebending stretch 3x30 seconds with overpressure each side -neck rotation stretch 3x30 seconds with overpressure each side -neck sidebending+ipsilateral rotation stretch 3x30 seconds with overpressure each side -seated pectoralis stretch 3x30 seconds bilateral -seated rhomboid stretch 3x30 seconds each side  Shoulder rolls forward 10x and backward 10x Head/neck circles 5x each direction Cervical retraction 10x Scapular retraction with red tband x10, then progressed to green x10, with verbal cues to inhibit upper traps contraction. Pt provided with green theraband to perform this at home.   Manual therapy: Soft tissue massage of neck and occiput, gentle distraction of occiput, distraction + rotation. Pt less tense today                                 PT Long Term Goals - 03/14/14 1434    PT LONG TERM GOAL #1   Title The patient will return demo HEP for neck stretching and postural stabilization independently.  Target date 04/20/2014   Time 4   Period Weeks   PT LONG TERM GOAL #2   Title The patient will decrease NDI by 10% (from 32%).  Target date 04/20/2014   Time 4   Period Weeks   PT LONG TERM GOAL #3   Title The patient will improve L rotation from 25 deg to 40 deg for improved functional mobility.  Target date 04/20/2014   Time 4   Period Weeks   PT LONG TERM GOAL #4   Title The patient will improve bilateral sidebending to 30 deg (from R 22 deg/ L 28 deg).  Target date 04/20/2014   Time 4   Period Weeks   PT LONG TERM GOAL #5   Title The patient will report pain < or equal to 2/10 at rest.  TArget date 04/20/2014   Time 4   Period Weeks               Plan - 03/28/14 1201    Clinical Impression Statement Pt responded well to treatment today. Demonstrated increased relaxation of neck musculature following stretching. She was less guarded during manual therapy. Will be ready to progress to more resistance strengthening next week.   PT Next Visit Plan scapular stabilizer strengthening with resistance, isometric neck strength        Problem List Patient Active Problem List   Diagnosis Date Noted  . External hemorrhoid 11/30/2012  . Fibroids 11/28/2012  .  Vaginal atrophy 11/28/2012  . H/O vitamin D deficiency 11/28/2012  . Osteopenia 11/28/2012  . Menopause 11/28/2012   Delrae Sawyers, PT,DPT,NCS 03/28/2014 12:04 PM Phone [phone] FAX [phone]         [facility] [address] [place], [place], [address] Phone: [phone]   Fax:  [phone]

## 2014-04-03 ENCOUNTER — Ambulatory Visit: Payer: Medicare Other

## 2014-04-05 ENCOUNTER — Ambulatory Visit: Payer: Medicare Other

## 2014-04-06 ENCOUNTER — Ambulatory Visit: Payer: Medicare Other | Admitting: Neurology

## 2014-04-10 ENCOUNTER — Ambulatory Visit: Payer: Medicare Other | Attending: Neurology | Admitting: Rehabilitative and Restorative Service Providers"

## 2014-04-10 DIAGNOSIS — M6281 Muscle weakness (generalized): Secondary | ICD-10-CM | POA: Insufficient documentation

## 2014-04-10 DIAGNOSIS — M542 Cervicalgia: Secondary | ICD-10-CM | POA: Diagnosis present

## 2014-04-10 NOTE — Therapy (Signed)
[facility] [address] [place], [place], [address] Phone: [phone]   Fax:  [phone]  Physical Therapy Treatment  Patient Details  Name: Miranda Gonzalez MRN: [id number] Date of Birth: 1940-05-31 Referring Provider:  Dixie Dials, MD  Encounter Date: 04/10/2014      PT End of Session - 04/10/14 1356    Visit Number 5   Number of Visits 8   Date for PT Re-Evaluation 04/20/14   Authorization Type UHC-no visit limit   PT Start Time 0935   PT Stop Time 1015   PT Time Calculation (min) 40 min   Activity Tolerance Patient tolerated treatment well   Behavior During Therapy [facility] for tasks assessed/performed      Past Medical History  Diagnosis Date  . Hypertension   . Occipital neuralgia     Past Surgical History  Procedure Laterality Date  . Tubal ligation    . Foot surgery Bilateral     There were no vitals filed for this visit.  Visit Diagnosis:  Muscle weakness  Neck pain      Subjective Assessment - 04/10/14 0938    Subjective The patient reports she feels she is doing much better.  She started the band exercises for upper back strengthening and feels that has helped with her posture.    Currently in Pain? Yes   Pain Score 1    Pain Location Neck   Pain Orientation Left   Pain Descriptors / Indicators Aching   Pain Type Chronic pain   Pain Onset More than a month ago   Pain Frequency Intermittent   Aggravating Factors  sleeping on the left side   Pain Relieving Factors sleeping on the right side, exercises      THERAPEUTIC EXERCISE: Green theraband reviewed scapular retraction x 10 reps Red theraband x 10 reps with external rotation with shoulder at neutral R and then L sides Green theraband shoulder extension with scapular retraction with band anchored at door x 10 reps Physioball (posterior to shoulder blades stabilized at wall) scapular retraction x 10 Chest/wall press ups x  10 Cat/cow quadriped position for 5 reps for spinal mobilization  Seated P/ROM with overpressure for lateral sidebending and for rotation and flexion for posterior neck stretching. A/ROM: see measurements in goal section.          PT Long Term Goals - 04/10/14 0941    PT LONG TERM GOAL #1   Title The patient will return demo HEP for neck stretching and postural stabilization independently.  Target date 04/20/2014   Time 4   Period Weeks   Status On-going   PT LONG TERM GOAL #2   Title The patient will decrease NDI by 10% (from 32%).  Target date 04/20/2014   Time 4   Period Weeks   Status On-going   PT LONG TERM GOAL #3   Title The patient will improve L rotation from 25 deg to 40 deg for improved functional mobility.  Target date 04/20/2014   Baseline L rotation is 48 degrees, and R rotation is 52 degrees.  Goal met on 04/10/2014.   Time 4   Period Weeks   Status Achieved   PT LONG TERM GOAL #4   Title The patient will improve bilateral sidebending to 30 deg (from R 22 deg/ L 28 deg).  Target date 04/20/2014   Baseline L sidebending 30 degrees and R sidebending 32 degrees.  Goal met on 04/10/2014.   Time 4  Period Weeks   Status Achieved   PT LONG TERM GOAL #5   Title The patient will report pain < or equal to 2/10 at rest.  TArget date 04/20/2014   Baseline Pt reports 1/10 pain on 04/10/2014.   Time 4   Period Weeks   Status Achieved               Plan - 04/10/14 1357    Clinical Impression Statement The patient met 3/5 LTGs.  PT to check remaining LTGs and progress HEP to patient tolerance.     PT Next Visit Plan scapular stabilizer strengthening with resistance, isometric neck strength; *add shoulder/scapular t-band exercises for home + wall push ups   Consulted and Agree with Plan of Care Patient        Problem List Patient Active Problem List   Diagnosis Date Noted  . External hemorrhoid 11/30/2012  . Fibroids 11/28/2012  . Vaginal atrophy 11/28/2012  .  H/O vitamin D deficiency 11/28/2012  . Osteopenia 11/28/2012  . Menopause 11/28/2012    [place], PT 04/10/2014, 1:59 PM  [facility] [address] [place], [place], [address] Phone: [phone]   Fax:  [phone]

## 2014-04-12 ENCOUNTER — Ambulatory Visit: Payer: Medicare Other | Admitting: Rehabilitative and Restorative Service Providers"

## 2014-04-17 ENCOUNTER — Ambulatory Visit: Payer: Medicare Other | Admitting: Rehabilitative and Restorative Service Providers"

## 2014-04-17 DIAGNOSIS — M6281 Muscle weakness (generalized): Secondary | ICD-10-CM

## 2014-04-17 DIAGNOSIS — M542 Cervicalgia: Secondary | ICD-10-CM

## 2014-04-17 NOTE — Therapy (Signed)
[facility] [address] [place], [place], [address] Phone: [phone]   Fax:  [phone]  Physical Therapy Treatment  Patient Details  Name: Miranda Gonzalez MRN: [id number] Date of Birth: June 27, 1940 Referring Provider:  Dixie Dials, MD  Encounter Date: 04/17/2014      PT End of Session - 04/17/14 1131    Visit Number 6   Number of Visits 8   Date for PT Re-Evaluation 05/05/14   Authorization Type UHC-no visit limit   PT Start Time 1105   PT Stop Time 1150   PT Time Calculation (min) 45 min   Activity Tolerance Patient tolerated treatment well   Behavior During Therapy [facility] for tasks assessed/performed      Past Medical History  Diagnosis Date  . Hypertension   . Occipital neuralgia     Past Surgical History  Procedure Laterality Date  . Tubal ligation    . Foot surgery Bilateral     There were no vitals filed for this visit.  Visit Diagnosis:  Muscle weakness  Neck pain      Subjective Assessment - 04/17/14 1111    Subjective The patient reports her neck has improved significantly.  She notes continued L shoulder pain at mid-range of abduction on the superior aspect of her shoulder.     Currently in Pain? Yes   Pain Score 1    Pain Location Neck  and L shoulder and back of R arm   Pain Orientation Left   Pain Descriptors / Indicators Sore   Pain Type Chronic pain   Pain Onset More than a month ago   Pain Frequency Intermittent   Aggravating Factors  *no head pain with sleeping position changes from last week, she does not L side sleeping does aggravate the shoulder   Pain Relieving Factors exercises     Initially patient demonstrated painful arc with A/ROM L shoulder scaption at approximately 70-90 degrees in ROM  THERAPEUTIC EXERCISE: Supine shoulder flexion x 5 reps Supine shoulder external rotation and flexion x 5 reps Chin tucks supine x 5 reps Wall push-ups x 8 reps Shoulder  wall/ball roll x 1 minute each direction L side and R side theraband green with scapular retraction Reviewed diagonal stretching Door frame upper back release/stretching A/ROM abduction and flexion in standing without painful arc after stretching Shoulder rolls and levator stretch reviewed  MANUAL: Gentle manual distraction for the L shoulder supine position P/ROM overpressure for L shoulder into ER  Sidelying R  trigger point release L parascapular musculature (rhomboids, levator scapulae, upper trapezius), repeated for R parascapular musculature with less muscle tightness/pain noted       PT Long Term Goals - 04/17/14 1211    PT LONG TERM GOAL #1   Title The patient will return demo HEP for neck stretching and postural stabilization independently.  Modified target date 05/05/2014.   Time 4   Period Weeks   Status On-going   PT LONG TERM GOAL #2   Title The patient will decrease NDI by 10% (from 32%).  Modified target date 05/05/2014.   Time 4   Period Weeks   Status On-going   PT LONG TERM GOAL #3   Title The patient will improve L rotation from 25 deg to 40 deg for improved functional mobility.  Target date 04/20/2014   Baseline L rotation is 48 degrees, and R rotation is 52 degrees.  Goal met on 04/10/2014.   Time 4   Period Weeks  Status Achieved   PT LONG TERM GOAL #4   Title The patient will improve bilateral sidebending to 30 deg (from R 22 deg/ L 28 deg).  Target date 04/20/2014   Baseline L sidebending 30 degrees and R sidebending 32 degrees.  Goal met on 04/10/2014.   Time 4   Period Weeks   Status Achieved   PT LONG TERM GOAL #5   Title The patient will report pain < or equal to 2/10 at rest.  TArget date 04/20/2014   Baseline Pt reports 1/10 pain on 04/10/2014.   Time 4   Period Weeks   Status Achieved               Plan - 04/17/14 1208    Clinical Impression Statement The patient has significantly improved neck pain.  She has increased L shoulder pain noted  with increased strengthening activities in therapy.  PT did not advance HEP due to L shoulder discomfort.  The patient initially had pain with A/ROM, and that improved in therapy session after stretching activities.  The patient notes pain radiates to suboccipital area L side with deep pressure/trigger point release L levator musculature.  PT to send updated cert order because she has completed 6/8 visits and we will extend date for 2 more weeks to cover remaining 2 visits from initial plan of care.   Pt will benefit from skilled therapeutic intervention in order to improve on the following deficits Decreased range of motion;Impaired flexibility;Improper body mechanics;Postural dysfunction;Increased muscle spasms;Decreased mobility;Decreased strength;Pain   Rehab Potential Good   PT Frequency 2x / week   PT Duration 2 weeks   PT Treatment/Interventions Moist Heat;Cryotherapy;Therapeutic activities;Patient/family education;Manual techniques;Therapeutic exercise;Passive range of motion;Neuromuscular re-education   PT Next Visit Plan reassess L shoulder discomfort and add scapular stabilizer strengthening with resistance, isometric neck strength; *add shoulder/scapular t-band exercises for home + wall push ups (to patient tolerance)   Consulted and Agree with Plan of Care Patient        Problem List Patient Active Problem List   Diagnosis Date Noted  . External hemorrhoid 11/30/2012  . Fibroids 11/28/2012  . Vaginal atrophy 11/28/2012  . H/O vitamin D deficiency 11/28/2012  . Osteopenia 11/28/2012  . Menopause 11/28/2012    [place], PT 04/17/2014, 12:21 PM  [facility] [address] [place], [place], [address] Phone: [phone]   Fax:  [phone]

## 2014-04-23 ENCOUNTER — Ambulatory Visit: Payer: Medicare Other

## 2014-04-23 DIAGNOSIS — M542 Cervicalgia: Secondary | ICD-10-CM | POA: Diagnosis not present

## 2014-04-23 DIAGNOSIS — M6281 Muscle weakness (generalized): Secondary | ICD-10-CM

## 2014-04-23 NOTE — Therapy (Signed)
[facility] [address] [place], [place], [address] Phone: [phone]   Fax:  [phone]  Physical Therapy Treatment  Patient Details  Name: Miranda Gonzalez MRN: [id number] Date of Birth: 1940/04/24 Referring Provider:  Dixie Dials, MD  Encounter Date: 04/23/2014      PT End of Session - 04/23/14 1201    Visit Number 9   Number of Visits 10   Authorization Type UHC-no visit limit   PT Start Time 1108   PT Stop Time 1150   PT Time Calculation (min) 42 min      Past Medical History  Diagnosis Date  . Hypertension   . Occipital neuralgia     Past Surgical History  Procedure Laterality Date  . Tubal ligation    . Foot surgery Bilateral     There were no vitals filed for this visit.  Visit Diagnosis:  Muscle weakness      Subjective Assessment - 04/23/14 1211    Subjective L shoulder pain is decreasing   Currently in Pain? Yes   Pain Score 1    Pain Location Shoulder   Pain Orientation Left   Aggravating Factors  external rotation therex with green theraband.   Pain Relieving Factors decreasing resistance to yellow theraband       Assessed pectoralis length on L shoulder and it was WNL -discussed decreasing freestyle swimming for exercises and focusing more on backstroke and breastroke, with emphasis on scapular squeeze at appropriate timing in breastroke -taught and performed rockwood 4 with LUE 2-3 x10 with extra time to discuss benefit of each exercise as well as correct form with each exercise (no shoulder elevation) performed with green theraband (pt has mild pain with L resisted external rotation but decreased resistance to yellow theraband and able to perform in pain free range) -taught and performed latissimus pull downs with green theraband 2x10                          PT Education - 04/23/14 1201    Education provided Yes   Education Details HEP-see pt  instructions   Person(s) Educated Patient   Methods Explanation;Demonstration;Handout   Comprehension Verbalized understanding;Returned demonstration             PT Long Term Goals - 04/17/14 1211    PT LONG TERM GOAL #1   Title The patient will return demo HEP for neck stretching and postural stabilization independently.  Modified target date 05/05/2014.   Time 4   Period Weeks   Status On-going   PT LONG TERM GOAL #2   Title The patient will decrease NDI by 10% (from 32%).  Modified target date 05/05/2014.   Time 4   Period Weeks   Status On-going   PT LONG TERM GOAL #3   Title The patient will improve L rotation from 25 deg to 40 deg for improved functional mobility.  Target date 04/20/2014   Baseline L rotation is 48 degrees, and R rotation is 52 degrees.  Goal met on 04/10/2014.   Time 4   Period Weeks   Status Achieved   PT LONG TERM GOAL #4   Title The patient will improve bilateral sidebending to 30 deg (from R 22 deg/ L 28 deg).  Target date 04/20/2014   Baseline L sidebending 30 degrees and R sidebending 32 degrees.  Goal met on 04/10/2014.   Time 4   Period Weeks   Status  Achieved   PT LONG TERM GOAL #5   Title The patient will report pain < or equal to 2/10 at rest.  TArget date 04/20/2014   Baseline Pt reports 1/10 pain on 04/10/2014.   Time 4   Period Weeks   Status Achieved               Plan - 04/23/14 1202    Clinical Impression Statement Pt is making significant progress. Reports decreased L shoulder pain today. Plan to d/c next visit if shoulder continues to feel better with new exercises.    PT Next Visit Plan Check goals, update POC if needed, reasses L shoulder pain, add diagonal scapular stabilizer theraband exercises        Problem List Patient Active Problem List   Diagnosis Date Noted  . External hemorrhoid 11/30/2012  . Fibroids 11/28/2012  . Vaginal atrophy 11/28/2012  . H/O vitamin D deficiency 11/28/2012  . Osteopenia 11/28/2012  .  Menopause 11/28/2012   Delrae Sawyers, PT,DPT,NCS 04/23/2014 12:12 PM Phone [phone] FAX [phone]         [facility] [address] [place], [place], [address] Phone: [phone]   Fax:  [phone]

## 2014-04-23 NOTE — Patient Instructions (Addendum)
Latissimus Pull Down -Hang the green theraband over the open door -Hold each end in each hand Keep elbows straight and pull the band down using both arms at the same time. -slowly control the band as it comes back up -Repeat 2 sets of 10 every other day  *also gave Rockwood 4 exercises 2x10 every other day with green theraband for all except shoulder external rotation (yellow theraband)

## 2014-04-26 ENCOUNTER — Ambulatory Visit: Payer: Medicare Other

## 2014-04-26 DIAGNOSIS — M542 Cervicalgia: Secondary | ICD-10-CM

## 2014-04-26 DIAGNOSIS — M6281 Muscle weakness (generalized): Secondary | ICD-10-CM

## 2014-04-26 NOTE — Patient Instructions (Signed)
Chest Press   Use 5# weight Lie on back with pelvis pressed back into mat, with knees and arms bent, elbows out from sides, palms forward. Inhale, then while exhaling, press arms toward ceiling. Keep palms forward. Hold 1 count. Then press shoulders farther forward. Slowly return to starting position. Repeat until you feel a slight muscle burn in chest/shoulder area. Perform 3 sets every other day.. All rights reserved.

## 2014-04-26 NOTE — Therapy (Signed)
[facility] [address] [place], [place], [address] Phone: [phone]   Fax:  [phone]  Physical Therapy Treatment  Patient Details  Name: Miranda Gonzalez MRN: [id number] Date of Birth: [birth year] Referring Provider:  Dixie Dials, MD  Encounter Date: 04/26/2014      PT End of Session - 04/26/14 1537    Visit Number 10   Number of Visits 10   Date for PT Re-Evaluation 05/05/14   Authorization Type U[employer]-no visit limit   PT Start Time 7673   PT Stop Time 1533   PT Time Calculation (min) 39 min      Past Medical History  Diagnosis Date  . Hypertension   . Occipital neuralgia     Past Surgical History  Procedure Laterality Date  . Tubal ligation    . Foot surgery Bilateral     There were no vitals filed for this visit.  Visit Diagnosis:  Neck pain  Muscle weakness      Subjective Assessment - 04/26/14 1501    Subjective Pain has significantly decreased in the left shoulder and neck, she feels more aware of her posture and overuse of the upper traps, and she is now able to lay on the dise without pain.   Currently in Pain? No/denies        Therex: Pt demonstrated correct performance of HEP -discontinued wall press due to wrist pain with this -trialed forearm plank and pt reported back pain with this-discontinued -taught supine chest press with good performance, verbal cue for lower abdominal bracing to prevent back pain with this. Good performance with 2# weight initially then with 5# weights.  Self care: -Pt performed simulated shoveling task to determine neck pain with this task with minimal neck pain reported -discussed recommendation to have pt see a dermatologist if the spot on her neck near occiput becomes an issue again. -discussed importance of continued movement of the neck, continued attention to posture and continued attention to over use of the upper traps to prevent future neck  pain. Pt verbalized understanding of all of the above.                         PT Education - 04/26/14 1536    Education provided Yes   Education Details updated HEP   Person(s) Educated Patient   Methods Explanation;Demonstration;Handout   Comprehension Verbalized understanding;Returned demonstration             PT Long Term Goals - 04/26/14 1504    PT LONG TERM GOAL #1   Title The patient will return demo HEP for neck stretching and postural stabilization independently.  Modified target date 05/05/2014.   Time 4   Period Weeks   Status Achieved   PT LONG TERM GOAL #2   Title The patient will decrease NDI by 10% (from 32%).  Modified target date 05/05/2014.   Time 4   Period Weeks   Status Achieved  NDI 10% on d/c   PT LONG TERM GOAL #3   Title The patient will improve L rotation from 25 deg to 40 deg for improved functional mobility.  Target date 04/20/2014   Baseline L rotation is 48 degrees, and R rotation is 52 degrees.  Goal met on 04/10/2014.   Time 4   Period Weeks   Status Achieved   PT LONG TERM GOAL #4   Title The patient will improve bilateral sidebending to 30 deg (from  R 22 deg/ L 28 deg).  Target date 04/20/2014   Baseline L sidebending 30 degrees and R sidebending 32 degrees.  Goal met on 04/10/2014.   Time 4   Period Weeks   Status Achieved   PT LONG TERM GOAL #5   Title The patient will report pain < or equal to 2/10 at rest.  TArget date 04/20/2014   Baseline Pt reports 1/10 pain on 04/10/2014.   Time 4   Period Weeks   Status Achieved               Plan - 05/21/14 1537    Clinical Impression Statement Pt made significant progress with decreased neck and shoulder pain. D/C today.          G-Codes - 05-21-14 1540    Functional Assessment Tool Used Neck Disability Index 10%   Functional Limitation Changing and maintaining body position   Changing and Maintaining Body Position Goal Status (M0349) At least 1 percent but less  than 20 percent impaired, limited or restricted   Changing and Maintaining Body Position Discharge Status (Z7915) At least 1 percent but less than 20 percent impaired, limited or restricted      Problem List Patient Active Problem List   Diagnosis Date Noted  . External hemorrhoid 11/30/2012  . Fibroids 11/28/2012  . Vaginal atrophy 11/28/2012  . H/O vitamin D deficiency 11/28/2012  . Osteopenia 11/28/2012  . Menopause 11/28/2012    Delrae Sawyers D 2014/05/21, 3:42 PM  [facility] [address] [place] [address], [place], [address] Phone: [phone]   Fax:  [phone]    PHYSICAL THERAPY DISCHARGE SUMMARY  Visits from Start of Care: 10  Current functional level related to goals / functional outcomes: Met all goals.      PT Long Term Goals - 05/21/14 1504    PT LONG TERM GOAL #1   Title The patient will return demo HEP for neck stretching and postural stabilization independently.  Modified target date 05/05/2014.   Time 4   Period Weeks   Status Achieved   PT LONG TERM GOAL #2   Title The patient will decrease NDI by 10% (from 32%).  Modified target date 05/05/2014.   Time 4   Period Weeks   Status Achieved  NDI 10% on d/c   PT LONG TERM GOAL #3   Title The patient will improve L rotation from 25 deg to 40 deg for improved functional mobility.  Target date 04/20/2014   Baseline L rotation is 48 degrees, and R rotation is 52 degrees.  Goal met on 04/10/2014.   Time 4   Period Weeks   Status Achieved   PT LONG TERM GOAL #4   Title The patient will improve bilateral sidebending to 30 deg (from R 22 deg/ L 28 deg).  Target date 04/20/2014   Baseline L sidebending 30 degrees and R sidebending 32 degrees.  Goal met on 04/10/2014.   Time 4   Period Weeks   Status Achieved   PT LONG TERM GOAL #5   Title The patient will report pain < or equal to 2/10 at rest.  TArget date 04/20/2014   Baseline Pt reports 1/10 pain on  04/10/2014.   Time 4   Period Weeks   Status Achieved        Remaining deficits: Mild neck pain only with high intensity activity Mild pain with L shoulder external rotation but this is improving with rotator cuff exercises.  Education / Equipment: HEP to address neck and shoulder pain/weakness  Plan: Patient agrees to discharge.  Patient goals were met. Patient is being discharged due to meeting the stated rehab goals.  ?????

## 2015-01-16 IMAGING — CR DG FOOT COMPLETE 3+V*R*
3 series · 3 of 3 positions shown · non-contrast
Comparison: None.

CLINICAL DATA: Injury of the 3rd digit.  Four days ago.

EXAM:
RIGHT FOOT COMPLETE - 3+ VIEW

[view not recorded (1 of 3)]
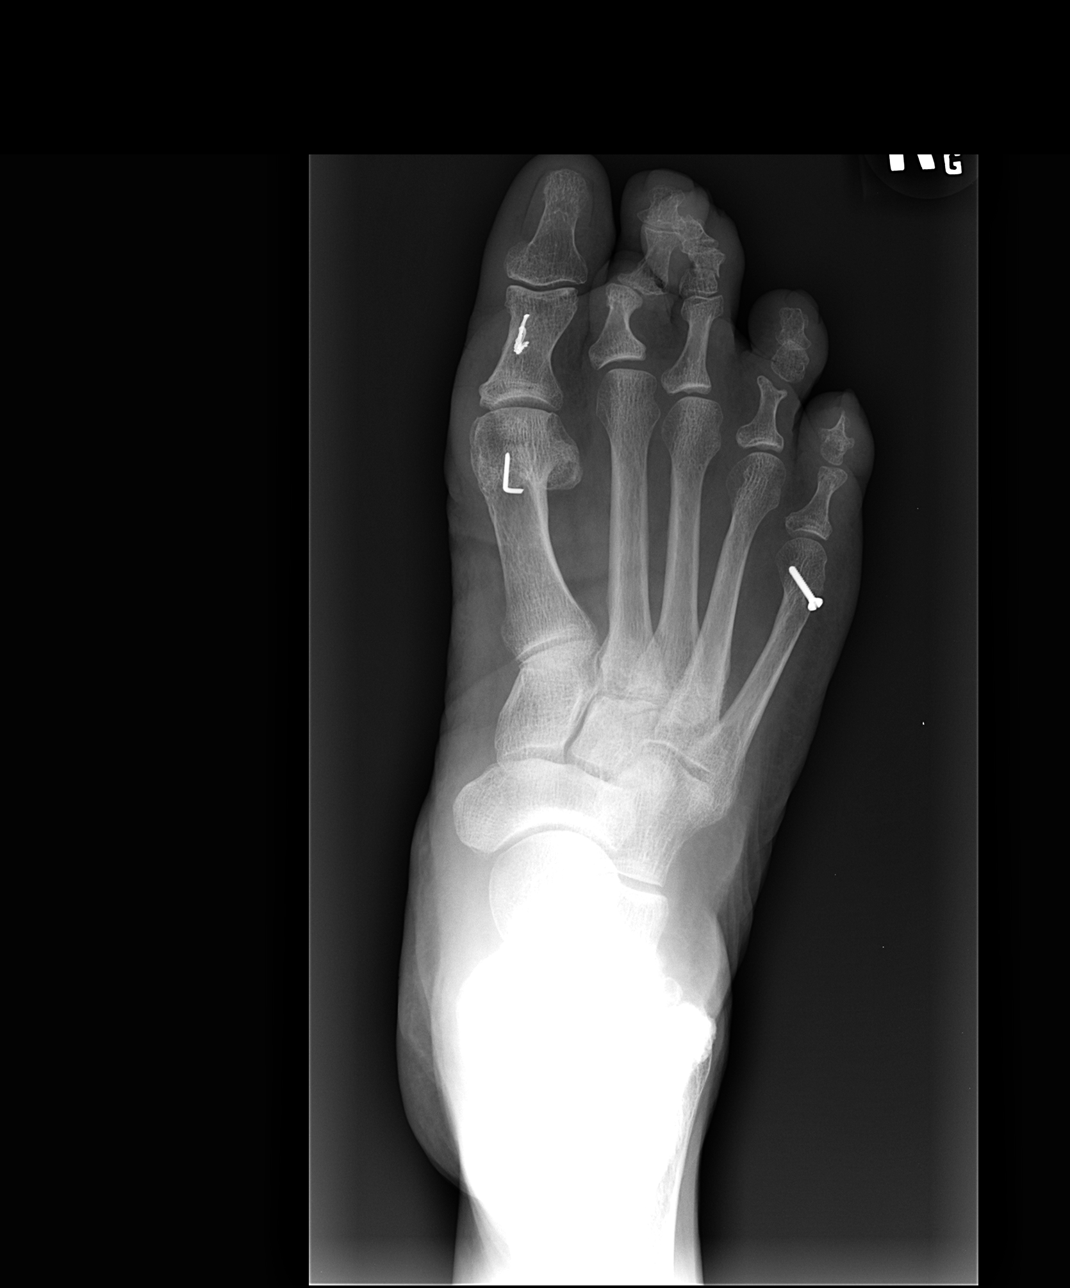

[view not recorded (2 of 3)]
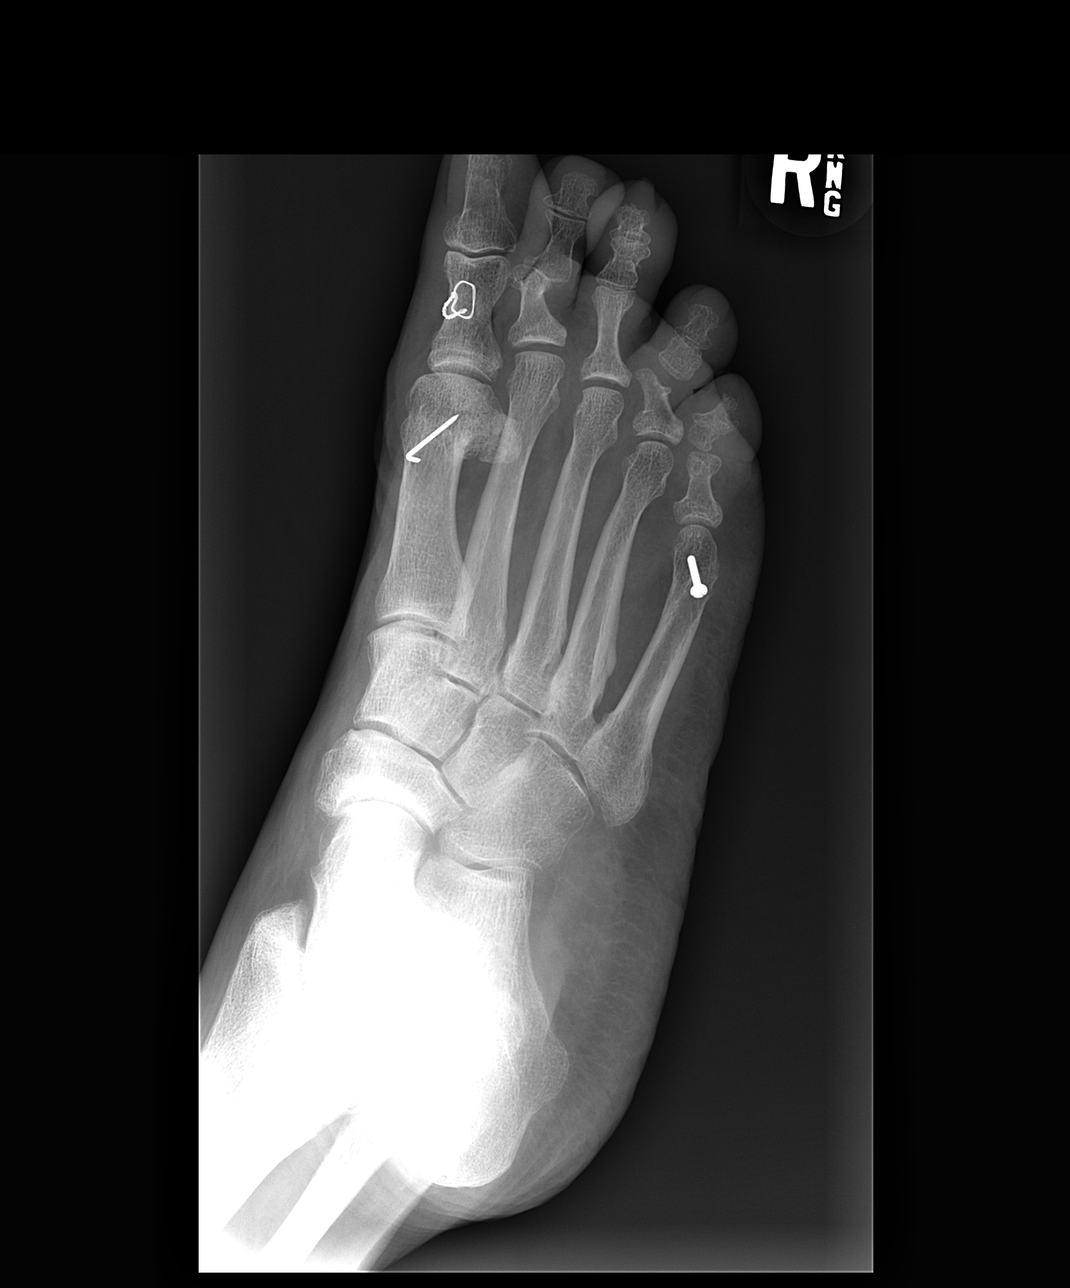

[view not recorded (3 of 3)]
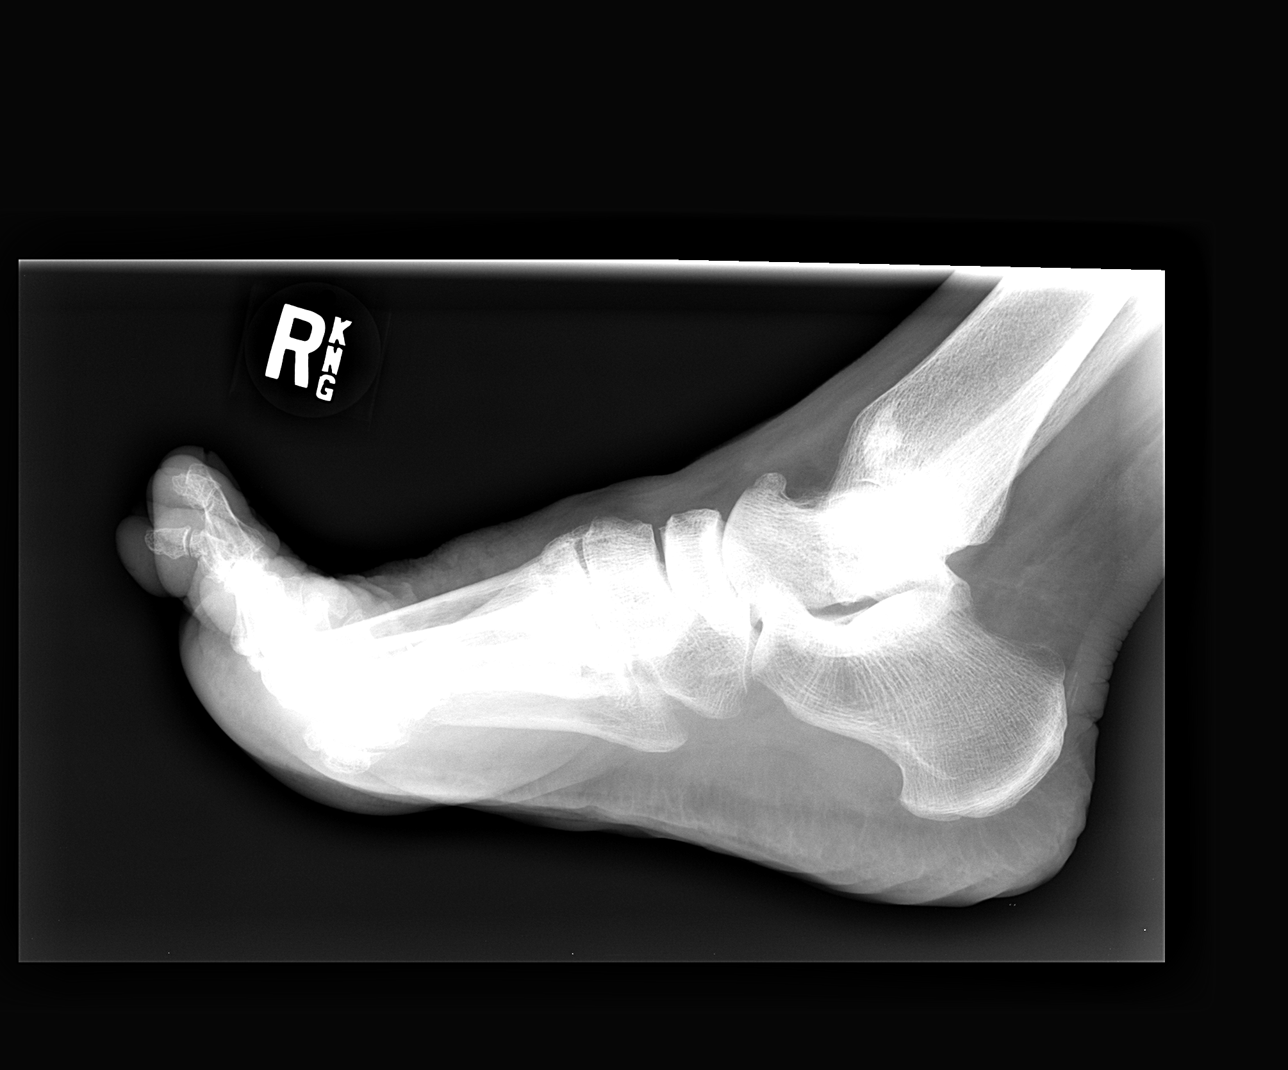

[3 of 3 positions shown; findings below may reference images not displayed]

FINDINGS: The patient has undergone previous surgery involving the distal
aspect of the 5th metatarsal as well as distal aspect of 1st
metatarsal and the midshaft of the proximal phalanx of the great
toe. The patient has sustained an acute fracture of the midshaft of
the middle phalanx of the 3rd toe. The proximal and distal phalanges
are grossly intact. The 3rd metatarsal appears intact. There is
chronic deformity at the 2nd PIP joint with lateral subluxation of
the middle phalanx with respect to the proximal phalanx. There are
chronic changes associated with the PIP joint of the 4th toe chronic
lateral subluxation of the middle phalanx. The hindfoot exhibits no
acute abnormality.
IMPRESSION: 1. The patient has sustained an acute minimally displaced
transversely oriented fracture of the midshaft of the middle phalanx
of the right 3rd toe.
2. There are chronic deformities centered at the PIP joints of the
2nd and 4th toes.
3. There are postsurgical changes associated with 1st and 5th rays.

## 2015-06-04 ENCOUNTER — Other Ambulatory Visit: Payer: Self-pay

## 2015-06-04 ENCOUNTER — Telehealth: Payer: Self-pay | Admitting: *Deleted

## 2015-06-04 DIAGNOSIS — N644 Mastodynia: Secondary | ICD-10-CM

## 2015-06-04 NOTE — Telephone Encounter (Signed)
Pt called c/o breast tenderness, I advised pt to schedule OV with provider, appt to schedule.

## 2015-06-06 ENCOUNTER — Ambulatory Visit (INDEPENDENT_AMBULATORY_CARE_PROVIDER_SITE_OTHER): Payer: Medicare Other | Admitting: Gynecology

## 2015-06-06 ENCOUNTER — Telehealth: Payer: Self-pay | Admitting: *Deleted

## 2015-06-06 ENCOUNTER — Encounter: Payer: Self-pay | Admitting: Gynecology

## 2015-06-06 VITALS — BP 128/76

## 2015-06-06 DIAGNOSIS — N644 Mastodynia: Secondary | ICD-10-CM | POA: Insufficient documentation

## 2015-06-06 NOTE — Telephone Encounter (Signed)
-----   Message from Terrance Mass, MD sent at 06/06/2015  8:17 AM EDT ----- Miranda Gonzalez please schedule diagnostic mammogram for this patient with left breast pain. Periareolar region between 12- 3 o'clock. Needs screening mammogram contralateral breast ( 3-D).

## 2015-06-06 NOTE — Progress Notes (Signed)
   HPI: Patient is a 75 year old who presented to the office today stating that for the past several days she was feeling tenderness on her left breast in the upper outer quadrant. Patient denied any recent trauma or injury. She denied any nipple discharge. She has not felt a mass per se during her own self breast examination. She had a normal mammogram in February 2016. She denies any first line relative of breast cancer only a distance grandmother. Patient has not been on any hormone replacement therapy. Patient denies any fever, chills, nausea, or vomiting. No change in appetite or change in weight. No GU or GI complaints. Patient denies any consumption of caffeine-containing products   ROS: A ROS was performed and pertinent positives and negatives are included in the history.  GENERAL: No fevers or chills. HEENT: No change in vision, no earache, sore throat or sinus congestion. NECK: No pain or stiffness. CARDIOVASCULAR: No chest pain or pressure. No palpitations. PULMONARY: No shortness of breath, cough or wheeze. GASTROINTESTINAL: No abdominal pain, nausea, vomiting or diarrhea, melena or bright red blood per rectum. GENITOURINARY: No urinary frequency, urgency, hesitancy or dysuria. MUSCULOSKELETAL: No joint or muscle pain, no back pain, no recent trauma. DERMATOLOGIC: No rash, no itching, no lesions. ENDOCRINE: No polyuria, polydipsia, no heat or cold intolerance. No recent change in weight. HEMATOLOGICAL: No anemia or easy bruising or bleeding. NEUROLOGIC: No headache, seizures, numbness, tingling or weakness. PSYCHIATRIC: No depression, no loss of interest in normal activity or change in sleep pattern.   [id number] Exam  Pulmonary/Chest:     Both breasts were examined sitting supine position. Left breast slightly larger than the right breast and normal for patient no unusual change. Both breasts were pendulous. No nipple inversion no skin discoloration no skin retraction no axillary or  supraclavicular lymphadenopathy. Right breast no palpable mass or tenderness left breast no palpable masses but tender from the 12:00 to the 3:00 position periareolar region.    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 We discussed breath support. We discussed discontinuation of all caffeine-containing products. We discussed vitamin D 600 units to take 1 by mouth daily. We are going to go ahead and schedule a diagnostic mammogram of the left breast and a screening mammogram of the right with a three-dimensional mammogram. She was encouraged to continue her monthly breast exam as well. She is to schedule her overdue annual exam in the next few months.    Greater than 50% of time was spent in counseling and coordinating care of this patient.   Time of consultation: 15   Minutes.

## 2015-06-06 NOTE — Telephone Encounter (Signed)
Orders placed at breast center they will contact pt to schedule. 

## 2015-06-06 NOTE — Patient Instructions (Signed)

## 2015-06-10 NOTE — Telephone Encounter (Signed)
Appointment on 06/11/15 @ 12:40pm

## 2015-06-11 ENCOUNTER — Ambulatory Visit
Admission: RE | Admit: 2015-06-11 | Discharge: 2015-06-11 | Disposition: A | Discharge status: Home / Self Care | Payer: Medicare Other | Source: Ambulatory Visit | Attending: Gynecology | Admitting: Gynecology

## 2015-06-11 DIAGNOSIS — N644 Mastodynia: Secondary | ICD-10-CM

## 2015-07-31 ENCOUNTER — Encounter: Payer: Medicare Other | Admitting: Gynecology

## 2015-07-31 DIAGNOSIS — Z0289 Encounter for other administrative examinations: Secondary | ICD-10-CM

## 2015-10-10 ENCOUNTER — Ambulatory Visit (INDEPENDENT_AMBULATORY_CARE_PROVIDER_SITE_OTHER): Payer: Medicare Other | Admitting: Gynecology

## 2015-10-10 ENCOUNTER — Encounter: Payer: Self-pay | Admitting: Gynecology

## 2015-10-10 VITALS — BP 122/80 | Ht 62.0 in | Wt 175.0 lb

## 2015-10-10 DIAGNOSIS — Z803 Family history of malignant neoplasm of breast: Secondary | ICD-10-CM | POA: Diagnosis not present

## 2015-10-10 DIAGNOSIS — D251 Intramural leiomyoma of uterus: Secondary | ICD-10-CM

## 2015-10-10 DIAGNOSIS — Z01419 Encounter for gynecological examination (general) (routine) without abnormal findings: Secondary | ICD-10-CM | POA: Diagnosis not present

## 2015-10-10 DIAGNOSIS — Z8739 Personal history of other diseases of the musculoskeletal system and connective tissue: Secondary | ICD-10-CM | POA: Diagnosis not present

## 2015-10-10 NOTE — Progress Notes (Signed)
Miranda Gonzalez May 28, 1940 [id number]   History:    75 y.o.  for annual gyn exam with no complaints today. Patient with history of leiomyomatous uteri. Her previous ultrasound scans have been as follows:  Ultrasound on 12/19/2012: Uterus measured 10.1 x 11.1 x 7.2 cm she was noted to have benign fibroids the largest one now measuring 68 x 56 x 60 mm. Endometrial stripe was 2.6 mm.  Ultrasound on 02/28/2014: Uterus measured 10.0 x 9.3 x 6.9 cm with endometrial stripe of 6.2 mm. Patient was stable intramural and subserous fibroids with the following dimensions: 4.3 x 3.2 cm, 4.2 x 3.3 cm calcified, midline 2.8 cm fibroid, 3.0 x 2.4 cm, 2.9 x 2.6 and meter fibroid also left ovarian fibroid measured 6.7 x 4.6 cm and calcified. Prominent endometrial cavity negative color flow. Right and left ovary atrophic no fluid in the cul-de-sac no change in fibroids from previous scan.  Patient denies any vaginal bleeding and is on no hormone replacement therapy and is sexually active. Patient has declined colonoscopy. Her vaccines are all up-to-date. She is overdue for mammogram as well as a bone density study. She does have history of osteopenia. Several years ago she had been placed on Fosamax until good for 6 months but discontinued secondary to side effects. Patient will overdue for bone density study last study on record 2006 with the lowest T score -2.1 at the left femoral neck. No comparison study available and no  Frax analysis done at another facility. Her most recent bone density study in 2016 was normal.  Patient refuses colonoscopy. Her PCP has been doing her blood work.  Past medical history,surgical history, family history and social history were all reviewed and documented in the EPIC chart.  Gynecologic History No LMP recorded. Patient is postmenopausal. Contraception: post menopausal status Last Pap: Several years ago. Results were: normal Last mammogram: 2017. Results were:  normal  Obstetric History OB History  Gravida Para Term Preterm AB Living  4 3     1 3   SAB TAB Ectopic Multiple Live Births  1            # Outcome Date GA Lbr Len/2nd Weight Sex Delivery Anes PTL Lv  4 SAB           3 Para           2 Para           1 Para                ROS: A ROS was performed and pertinent positives and negatives are included in the history.  GENERAL: No fevers or chills. HEENT: No change in vision, no earache, sore throat or sinus congestion. NECK: No pain or stiffness. CARDIOVASCULAR: No chest pain or pressure. No palpitations. PULMONARY: No shortness of breath, cough or wheeze. GASTROINTESTINAL: No abdominal pain, nausea, vomiting or diarrhea, melena or bright red blood per rectum. GENITOURINARY: No urinary frequency, urgency, hesitancy or dysuria. MUSCULOSKELETAL: No joint or muscle pain, no back pain, no recent trauma. DERMATOLOGIC: No rash, no itching, no lesions. ENDOCRINE: No polyuria, polydipsia, no heat or cold intolerance. No recent change in weight. HEMATOLOGICAL: No anemia or easy bruising or bleeding. NEUROLOGIC: No headache, seizures, numbness, tingling or weakness. PSYCHIATRIC: No depression, no loss of interest in normal activity or change in sleep pattern.     Exam: chaperone present  BP 122/80   Ht 5\' 2"  (1.575 m)   Wt 175 lb (79.4 kg)  BMI 32.01 kg/m   Body mass index is 32.01 kg/m.  General appearance : Well developed well nourished female. No acute distress HEENT: Eyes: no retinal hemorrhage or exudates,  Neck supple, trachea midline, no carotid bruits, no thyroidmegaly Lungs: Clear to auscultation, no rhonchi or wheezes, or rib retractions  Heart: Regular rate and rhythm, no murmurs or gallops Breast:Examined in sitting and supine position were symmetrical in appearance, no palpable masses or tenderness,  no skin retraction, no nipple inversion, no nipple discharge, no skin discoloration, no axillary or supraclavicular  lymphadenopathy Abdomen: no palpable masses or tenderness, no rebound or guarding Extremities: no edema or skin discoloration or tenderness  Pelvic:  Bartholin, Urethra, Skene Glands: Within normal limits             Vagina: No gross lesions or discharge  Cervix: No gross lesions or discharge  Uterus  10 week size irregular, normal size, shape and consistency, non-tender and mobile  Adnexa  Without masses or tenderness  Anus and perineum  normal   Rectovaginal refused             Hemoccult PCP provides     Assessment/Plan:  75 y.o. female for annual exam with history of leiomyomatous uteri. She will schedule an ultrasound for next week to compare with previous years to make sure that it has not continued to grow. Patient was reminded of the importance of calcium vitamin D and weightbearing exercises for osteoporosis prevention. We discussed importance of monthly breast exam. She will be due for her next bone density study in 2018 as well as her mammogram. She refused a colonoscopy.   Terrance Mass MD, 12:03 PM 10/10/2015

## 2015-10-16 ENCOUNTER — Telehealth: Payer: Self-pay | Admitting: *Deleted

## 2015-10-16 NOTE — Telephone Encounter (Signed)
Per [facility] at [facility] ref cal # [phone]  [place] and visit are covered with $35 copay. KW CMA

## 2015-10-21 ENCOUNTER — Ambulatory Visit (INDEPENDENT_AMBULATORY_CARE_PROVIDER_SITE_OTHER): Payer: Medicare Other

## 2015-10-21 ENCOUNTER — Encounter: Payer: Self-pay | Admitting: Gynecology

## 2015-10-21 ENCOUNTER — Ambulatory Visit (INDEPENDENT_AMBULATORY_CARE_PROVIDER_SITE_OTHER): Payer: Medicare Other | Admitting: Gynecology

## 2015-10-21 VITALS — BP 120/78

## 2015-10-21 DIAGNOSIS — D251 Intramural leiomyoma of uterus: Secondary | ICD-10-CM

## 2015-10-21 NOTE — Progress Notes (Signed)
   Patient is a 75 year old who was seen in the office for her annual exam in October 5 and is here for ultrasound to compare with previous studies because of her history of fibroid uterus. Patient denies any vaginal bleeding no hormone replacement therapy. Ultrasounds as follows:  Ultrasound on 12/19/2012: Uterus measured 10.1 x 11.1 x 7.2 cm she was noted to have benign fibroids the largest one now measuring 68 x 56 x 60 mm. Endometrial stripe was 2.6 mm.  Ultrasound on 02/28/2014: Uterus measured 10.0 x 9.3 x 6.9 cm with endometrial stripe of 6.2 mm. Patient was stable intramural and subserous fibroids with the following dimensions: 4.3 x 3.2 cm, 4.2 x 3.3 cm calcified, midline 2.8 cm fibroid, 3.0 x 2.4 cm, 2.9 x 2.6 and meter fibroid also left ovarian fibroid measured 6.7 x 4.6 cm and calcified. Prominent endometrial cavity negative color flow. Right and left ovary atrophic no fluid in the cul-de-sac no change in fibroids from previous scan.   Ultrasound today: Uterus measured 9.3 x 8.9 x 5.3 cm with endometrial stripe of 2.7 mm. Calcified intramural fibroid measured 6.0 x 4.9 cm, 2.5 x 2.5 cm, 1.6 x 1.9 cm, 5.4 x 3.4 cm, 5.4 x 3.8, 1.6 x 1.4 cm, right and left ovary normal. No fluid in the cul-de-sac.  Assessment/plan: Postmenopausal patient with stable fibroid uterus asymptomatic we'll continue to monitor on a yearly basis or sooner patient become symptomatic.  Greater than 50% time was spent counseling [place] for this patient time of consultation 10 minutes

## 2015-10-21 NOTE — Patient Instructions (Signed)
Uterine Fibroids Uterine fibroids are tissue masses (tumors) that can develop in the womb (uterus). They are also called leiomyomas. This type of tumor is not cancerous (benign) and does not spread to other parts of the body outside of the pelvic area, which is between the hip bones. Occasionally, fibroids may develop in the fallopian tubes, in the cervix, or on the support structures (ligaments) that surround the uterus. You can have one or many fibroids. Fibroids can vary in size, weight, and where they grow in the uterus. Some can become quite large. Most fibroids do not require medical treatment. CAUSES A fibroid can develop when a single uterine cell keeps growing (replicating). Most cells in the human body have a control mechanism that keeps them from replicating without control. SIGNS AND SYMPTOMS Symptoms may include:   Heavy bleeding during your period.  Bleeding or spotting between periods.  Pelvic pain and pressure.  Bladder problems, such as needing to urinate more often (urinary frequency) or urgently.  Inability to reproduce offspring (infertility).  Miscarriages. DIAGNOSIS Uterine fibroids are diagnosed through a physical exam. Your health care provider may feel the lumpy tumors during a pelvic exam. Ultrasonography and an MRI may be done to determine the size, location, and number of fibroids. TREATMENT Treatment may include:  Watchful waiting. This involves getting the fibroid checked by your health care provider to see if it grows or shrinks. Follow your health care provider's recommendations for how often to have this checked.  Hormone medicines. These can be taken by mouth or given through an intrauterine device (IUD).  Surgery.  Removing the fibroids (myomectomy) or the uterus (hysterectomy).  Removing blood supply to the fibroids (uterine artery embolization). If fibroids interfere with your fertility and you want to become pregnant, your health care provider  may recommend having the fibroids removed.  HOME CARE INSTRUCTIONS  Keep all follow-up visits as directed by your health care provider. This is important.  Take medicines only as directed by your health care provider.  If you were prescribed a hormone treatment, take the hormone medicines exactly as directed.  Do not take aspirin, because it can cause bleeding.  Ask your health care provider about taking iron pills and increasing the amount of dark green, leafy vegetables in your diet. These actions can help to boost your blood iron levels, which may be affected by heavy menstrual bleeding.  Pay close attention to your period and tell your health care provider about any changes, such as:  Increased blood flow that requires you to use more pads or tampons than usual per month.  A change in the number of days that your period lasts per month.  A change in symptoms that are associated with your period, such as abdominal cramping or back pain. SEEK MEDICAL CARE IF:  You have pelvic pain, back pain, or abdominal cramps that cannot be controlled with medicines.  You have an increase in bleeding between and during periods.  You soak tampons or pads in a half hour or less.  You feel lightheaded, extra tired, or weak. SEEK IMMEDIATE MEDICAL CARE IF:  You faint.  You have a sudden increase in pelvic pain.   This information is not intended to replace advice given to you by your health care provider. Make sure you discuss any questions you have with your health care provider.   Document Released: 12/20/1999 Document Revised: 01/12/2014 Document Reviewed: 06/20/2013 Elsevier Interactive Patient Education 2016 [employer]  

## 2016-05-20 ENCOUNTER — Encounter: Payer: Self-pay | Admitting: Gynecology

## 2016-08-31 ENCOUNTER — Encounter (HOSPITAL_COMMUNITY): Payer: Self-pay | Admitting: Emergency Medicine

## 2016-08-31 ENCOUNTER — Ambulatory Visit (HOSPITAL_COMMUNITY)
Admission: EM | Admit: 2016-08-31 | Discharge: 2016-08-31 | Disposition: A | Discharge status: Home / Self Care | Payer: Medicare Other | Attending: Family Medicine | Admitting: Family Medicine

## 2016-08-31 ENCOUNTER — Ambulatory Visit (INDEPENDENT_AMBULATORY_CARE_PROVIDER_SITE_OTHER): Payer: Medicare Other

## 2016-08-31 DIAGNOSIS — S92102A Unspecified fracture of left talus, initial encounter for closed fracture: Secondary | ICD-10-CM

## 2016-08-31 DIAGNOSIS — S99922A Unspecified injury of left foot, initial encounter: Secondary | ICD-10-CM

## 2016-08-31 NOTE — ED Triage Notes (Signed)
Patient was in [place] and patient fell after missing a step.  This fall occurred on Friday.  Left foot hit the ground first, then went to left knee, tried to brace self with right knee, pressure in right buttocks.  And soreness in right shoulder and upper arm.

## 2016-08-31 NOTE — ED Provider Notes (Signed)
Miranda Gonzalez   [id number] 08/31/16 Arrival Time: 5885  ASSESSMENT & PLAN:  1. Foot injury, left, initial encounter   2. Closed nondisplaced fracture of left talus, unspecified portion of talus, initial encounter    Placed in walking boot. To schedule orthopaedic surgery follow up appointment. OTC analgesics as needed.  Reviewed expectations re: course of current medical issues. Questions answered. Outlined signs and symptoms indicating need for more acute intervention. Patient verbalized understanding. After Visit Summary given.   SUBJECTIVE:  Miranda Gonzalez is a 76 y.o. female who presents with complaint of L foot pain after a fall 3 days ago. Some swelling over foot now. Pain described as a dull ache. No specific aggravating or alleviating factors reported. Has been able to bear weight. No sensation changes. No OTC analgesics. Distant h/o surgery to this foot. Date not recalled. Also mild soreness of shoulders and knees but this is improving.  ROS: As per HPI.   OBJECTIVE:  Vitals:   08/31/16 1152  BP: 128/69  Pulse: 71  Resp: 18  Temp: 98.4 F (36.9 C)  TempSrc: Oral  SpO2: 97%     General appearance: alert; no distress Extremities: LLE with swelling of dorsal foot; tender over proximal dorsal foot; FROM at ankle; mild lateral foot tenderness; no bruising; 2+ DP/PT pulses Skin: warm and dry Neurologic: normal gait Psychological: alert and cooperative; normal mood and affect  Past Medical History:  Diagnosis Date  . Fibroid uterus   . Hypertension   . Occipital neuralgia     Results for orders placed or performed during the hospital encounter of 05/11/08  POCT urinalysis dip (device)  Result Value Ref Range   Glucose, UA NEGATIVE NEGATIVE mg/dL   Bilirubin Urine NEGATIVE NEGATIVE   Ketones, ur NEGATIVE NEGATIVE mg/dL   Specific Gravity, Urine 1.025 1.005 - 1.030   Hgb urine dipstick NEGATIVE NEGATIVE   pH 5.5 5.0 - 8.0   Protein, ur NEGATIVE  NEGATIVE mg/dL   Urobilinogen, UA 0.2 0.0 - 1.0 mg/dL   Nitrite NEGATIVE NEGATIVE   Leukocytes, UA  NEGATIVE    NEGATIVE Biochemical Testing Only. Please order routine urinalysis from main lab if confirmatory testing is needed.    Imaging: Dg Foot Complete Left  Result Date: 08/31/2016 CLINICAL DATA:  Left foot pain after recent injury. EXAM: LEFT FOOT - COMPLETE 3+ VIEW COMPARISON:  08/28/2013 left foot radiographs FINDINGS: There is a tiny nondisplaced avulsion fracture at the dorsal distal talus with overlying soft tissue swelling. No additional fracture. No dislocation. Stable screw in the distal left fifth metatarsal with associated healed deformity. Stable wire in the distal left first metatarsal with healed deformity. No evidence of hardware fracture or loosening. Stable moderate first metatarsal-phalangeal joint and proximal interphalangeal joint left third toe osteoarthritis. Stable degenerative changes in the dorsal tarsal joints. Moderate Achilles left calcaneal spur, increased. IMPRESSION: 1. Tiny nondisplaced avulsion fracture at the dorsal distal talus with overlying soft tissue swelling. 2. Stable postsurgical changes in the first and fifth metatarsals with polyarticular left foot osteoarthritis. Electronically Signed   By: Ilona Sorrel M.D.   On: 08/31/2016 12:56    Allergies  Allergen Reactions  . 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 Phenergan [Promethazine Hcl]     Family History  Problem Relation Age of Onset  . Heart disease Mother   . Hypertension Mother   . Diabetes Father   . Heart disease Father   .  Diabetes Sister   . Heart disease Sister   . Hypertension Sister   . Breast cancer Sister   . Diabetes Brother   . Thyroid disease Daughter   . Migraines Daughter   . Cancer Maternal Grandmother        breast    Past Surgical History:  Procedure Laterality Date  . FOOT SURGERY Bilateral   . TUBAL LIGATION            Miranda Kick, MD 08/31/16 1318

## 2016-08-31 NOTE — Discharge Instructions (Signed)
Please call for an orthopaedic follow up appointment.

## 2016-09-03 ENCOUNTER — Encounter (INDEPENDENT_AMBULATORY_CARE_PROVIDER_SITE_OTHER): Payer: Self-pay | Admitting: Family

## 2016-09-03 ENCOUNTER — Ambulatory Visit (INDEPENDENT_AMBULATORY_CARE_PROVIDER_SITE_OTHER): Payer: Medicare Other | Admitting: Family

## 2016-09-03 DIAGNOSIS — S93492A Sprain of other ligament of left ankle, initial encounter: Secondary | ICD-10-CM

## 2016-09-03 NOTE — Progress Notes (Signed)
Office Visit Note   Patient: Miranda Gonzalez           Date of Birth: 02-Apr-1940           MRN: [id number] Visit Date: 09/03/2016              Requested by: Dixie Dials, MD [address] [place], [place] [address] PCP: Dixie Dials, MD  Chief Complaint  Patient presents with  . Left Foot - Injury      HPI: The patient is a 76 year old woman today 3 weeks status post a fall with eversion injury to the left ankle. Has been in a CAM boot since Monday. Was seen in [facility]. Radiographs showed a tiny nondisplaced avulsion fracture to the dorsal distal talus.   Does complain of swelling and pain to dorsolateral foot. States has been getting around easily in CAM, no concerns.   Assessment & Plan: Visit Diagnoses:  1. Sprain of anterior talofibular ligament of left ankle, initial encounter     Plan: As clinically has no sign of fracture, will place in ASO for ATFL sprain. IBU or Aleve bid x 2 weeks. Follow up in office in 2 weeks, if right shoulder still painful may consider depo injection at that time.   Follow-Up Instructions: Return in about 2 weeks (around 09/17/2016).   Left Ankle Exam  Swelling: moderate  Tenderness  The patient is experiencing tenderness in the ATF and deltoid.   Range of Motion  The patient has normal left ankle ROM.   Tests  Anterior drawer: negative  Other  Erythema: absent Pulse: present      Patient is alert, oriented, no adenopathy, well-dressed, normal affect, normal respiratory effort.   Imaging: No results found. No images are attached to the encounter.  Labs: No results found for: HGBA1C, ESRSEDRATE, CRP, LABURIC, REPTSTATUS, GRAMSTAIN, CULT, LABORGA  Orders:  No orders of the defined types were placed in this encounter.  No orders of the defined types were placed in this encounter.    Procedures: No procedures performed  Clinical Data: No additional findings.  ROS:  All other systems negative,  except as noted in the HPI. Review of Systems  Constitutional: Negative for chills and fever.  Musculoskeletal: Positive for arthralgias, joint swelling and myalgias.  Neurological: Negative for weakness.    Objective: Vital Signs: There were no vitals taken for this visit.  Specialty Comments:  No specialty comments available.  PMFS History: Patient Active Problem List   Diagnosis Date Noted  . Mastodynia, female 06/06/2015  . External hemorrhoid 11/30/2012  . Fibroids 11/28/2012  . Vaginal atrophy 11/28/2012  . H/O vitamin D deficiency 11/28/2012  . Osteopenia 11/28/2012  . Menopause 11/28/2012   Past Medical History:  Diagnosis Date  . Fibroid uterus   . Hypertension   . Occipital neuralgia     Family History  Problem Relation Age of Onset  . Heart disease Mother   . Hypertension Mother   . Diabetes Father   . Heart disease Father   . Diabetes Sister   . Heart disease Sister   . Hypertension Sister   . Breast cancer Sister   . Diabetes Brother   . Thyroid disease Daughter   . Migraines Daughter   . Cancer Maternal Grandmother        breast     Past Surgical History:  Procedure Laterality Date  . FOOT SURGERY Bilateral   . TUBAL LIGATION     Social History  Occupational History  . Not on file.   Social History Main Topics  . Smoking status: Former Research scientist (life sciences)  . Smokeless tobacco: Never Used  . Alcohol use 0.0 oz/week     Comment: occasional  . Drug use: No  . Sexual activity: Yes    Partners: Male

## 2016-09-17 ENCOUNTER — Ambulatory Visit (INDEPENDENT_AMBULATORY_CARE_PROVIDER_SITE_OTHER): Payer: Medicare Other | Admitting: Orthopedic Surgery

## 2016-09-18 ENCOUNTER — Encounter (INDEPENDENT_AMBULATORY_CARE_PROVIDER_SITE_OTHER): Payer: Self-pay

## 2016-09-18 ENCOUNTER — Ambulatory Visit (INDEPENDENT_AMBULATORY_CARE_PROVIDER_SITE_OTHER): Payer: Medicare Other | Admitting: Family

## 2016-10-05 ENCOUNTER — Ambulatory Visit (INDEPENDENT_AMBULATORY_CARE_PROVIDER_SITE_OTHER): Payer: Medicare Other | Admitting: Family

## 2016-10-05 ENCOUNTER — Encounter (INDEPENDENT_AMBULATORY_CARE_PROVIDER_SITE_OTHER): Payer: Self-pay | Admitting: Family

## 2016-10-05 ENCOUNTER — Ambulatory Visit (INDEPENDENT_AMBULATORY_CARE_PROVIDER_SITE_OTHER): Payer: Medicare Other

## 2016-10-05 DIAGNOSIS — M25572 Pain in left ankle and joints of left foot: Secondary | ICD-10-CM | POA: Diagnosis not present

## 2016-10-05 DIAGNOSIS — M25511 Pain in right shoulder: Secondary | ICD-10-CM | POA: Diagnosis not present

## 2016-10-05 NOTE — Progress Notes (Signed)
\   Office Visit Note   Patient: Miranda Gonzalez           Date of Birth: [birth year]           MRN: [id number] Visit Date: 10/05/2016              Requested by: Dixie Dials, MD [address] [place], [place] [address] PCP: Dixie Dials, MD  Chief Complaint  Patient presents with  . Right Shoulder - Pain  . Left Ankle - Follow-up      HPI: The patient is a 76 year old woman today seen in follow up for left ankle sprain. Is nearly 2 months status post a fall with eversion injury to the left ankle. Does complain of mild swelling and is pleased with reduction in pain to dorsolateral foot. States has been getting around easily in ASO. No concerns. Wonders when can discontinue the ASO.   Complains of continued right shoulder pain. Had a fall several weeks ago. Landed against a door on the right shoulder. Has deep pain in shoulder, in glenohumeral joint. Pain with above head and behind back reaching. No loss of rom. Radiating pain down bicep. Gradually improving. Taking Advil without relief.  States many years ago had issues with this shoulder which resolved with PT, did go to [facility] at that time.   Assessment & Plan: Visit Diagnoses:  1. Acute pain of right shoulder   2. Pain in left ankle and joints of left foot     Plan: Declined depomedrol injection right shoulder. Will continue with HEP and Ibu. For ankle may discontinue ASO and advance activities as tolerated.   Follow-Up Instructions: Return if symptoms worsen or fail to improve.   Left Ankle Exam  Swelling: mild  Tenderness  The patient is experiencing tenderness in the ATF.   Range of Motion  The patient has normal left ankle ROM.   Tests  Anterior drawer: negative  Other  Erythema: absent Pulse: present   Right Shoulder Exam   Tenderness  The patient is experiencing no tenderness.    Range of Motion  The patient has normal right shoulder ROM.  Tests  Drop Arm:  positive Impingement: negative      Patient is alert, oriented, no adenopathy, well-dressed, normal affect, normal respiratory effort.   Imaging: Xr Shoulder Right  Result Date: 10/05/2016 Radiographs of the right shoulder are negative for acute finding or fracture. Does have superior migration of humeral head.   No images are attached to the encounter.  Labs: No results found for: HGBA1C, ESRSEDRATE, CRP, LABURIC, REPTSTATUS, GRAMSTAIN, CULT, LABORGA  Orders:  Orders Placed This Encounter  Procedures  . XR Shoulder Right   No orders of the defined types were placed in this encounter.    Procedures: No procedures performed  Clinical Data: No additional findings.  ROS:  All other systems negative, except as noted in the HPI. Review of Systems  Constitutional: Negative for chills and fever.  Musculoskeletal: Positive for arthralgias, joint swelling and myalgias.  Neurological: Negative for weakness.    Objective: Vital Signs: There were no vitals taken for this visit.  Specialty Comments:  No specialty comments available.  PMFS History: Patient Active Problem List   Diagnosis Date Noted  . Mastodynia, female 06/06/2015  . External hemorrhoid 11/30/2012  . Fibroids 11/28/2012  . Vaginal atrophy 11/28/2012  . H/O vitamin D deficiency 11/28/2012  . Osteopenia 11/28/2012  . Menopause 11/28/2012   Past Medical History:  Diagnosis Date  .  Fibroid uterus   . Hypertension   . Occipital neuralgia     Family History  Problem Relation Age of Onset  . Heart disease Mother   . Hypertension Mother   . Diabetes Father   . Heart disease Father   . Diabetes Sister   . Heart disease Sister   . Hypertension Sister   . Breast cancer Sister   . Diabetes Brother   . Thyroid disease Daughter   . Migraines Daughter   . Cancer Maternal Grandmother        breast     Past Surgical History:  Procedure Laterality Date  . FOOT SURGERY Bilateral   . TUBAL LIGATION      Social History   Occupational History  . Not on file.   Social History Main Topics  . Smoking status: Former Research scientist (life sciences)  . Smokeless tobacco: Never Used  . Alcohol use 0.0 oz/week     Comment: occasional  . Drug use: No  . Sexual activity: Yes    Partners: Male

## 2017-04-13 ENCOUNTER — Encounter: Payer: Self-pay | Admitting: Gynecology

## 2017-04-13 ENCOUNTER — Ambulatory Visit: Payer: Medicare Other | Admitting: Gynecology

## 2017-04-13 VITALS — BP 124/80 | Ht 62.0 in | Wt 175.0 lb

## 2017-04-13 DIAGNOSIS — D259 Leiomyoma of uterus, unspecified: Secondary | ICD-10-CM | POA: Diagnosis not present

## 2017-04-13 DIAGNOSIS — N952 Postmenopausal atrophic vaginitis: Secondary | ICD-10-CM

## 2017-04-13 DIAGNOSIS — Z01411 Encounter for gynecological examination (general) (routine) with abnormal findings: Secondary | ICD-10-CM | POA: Diagnosis not present

## 2017-04-13 NOTE — Patient Instructions (Signed)
Call to Schedule your mammogram  Facilities in [place]: 1)  [facility]. [facility], [address] Phone: [phone] 2)  Dr. Bertrand at [facility]  [address] Phone: [phone]     Mammogram A mammogram is an X-ray test to find changes in a woman's breast. You should get a mammogram if:  You are 76 years of age or older  You have risk factors.   Your doctor recommends that you have one.  BEFORE THE TEST  Do not schedule the test the week before your period, especially if your breasts are sore during this time.  On the day of your mammogram:  Wash your breasts and armpits well. After washing, do not put on any deodorant or talcum powder on until after your test.   Eat and drink as you usually do.   Take your medicines as usual.   If you are diabetic and take insulin, make sure you:   Eat before coming for your test.   Take your insulin as usual.   If you cannot keep your appointment, call before the appointment to cancel. Schedule another appointment.  TEST  You will need to undress from the waist up. You will put on a hospital gown.   Your breast will be put on the mammogram machine, and it will press firmly on your breast with a piece of plastic called a compression paddle. This will make your breast flatter so that the machine can X-ray all parts of your breast.   Both breasts will be X-rayed. Each breast will be X-rayed from above and from the side. An X-ray might need to be taken again if the picture is not good enough.   The mammogram will last about 15 to 30 minutes.  AFTER THE TEST Finding out the results of your test Ask when your test results will be ready. Make sure you get your test results.  Document Released: 03/20/2008 Document Revised: 12/11/2010 Document Reviewed: 03/20/2008 [employer] Patient Information 2012 [employer].   

## 2017-04-13 NOTE — Progress Notes (Signed)
    ADJA RUFF 05/08/1940 [id number]        77 y.o.  [id number] for annual gynecologic exam.  Former patient of Dr. Toney Rakes.  Without gynecologic complaints.  Past medical history,surgical history, problem list, medications, allergies, family history and social history were all reviewed and documented as reviewed in the EPIC chart.  ROS:  Performed with pertinent positives and negatives included in the history, assessment and plan.   Additional significant findings : None   Exam: Caryn Bee assistant Vitals:   04/13/17 0835  BP: 124/80  Weight: 175 lb (79.4 kg)  Height: 5\' 2"  (3.825 m)   Body mass index is 32.01 kg/m.  General appearance:  Normal affect, orientation and appearance. Skin: Grossly normal HEENT: Without gross lesions.  No cervical or supraclavicular adenopathy. Thyroid normal.  Lungs:  Clear without wheezing, rales or rhonchi Cardiac: RR, without RMG Abdominal:  Soft, nontender, without masses, guarding, rebound, organomegaly or hernia Breasts:  Examined lying and sitting without masses, retractions, discharge or axillary adenopathy. Pelvic:  Ext, BUS, Vagina: With atrophic changes  Cervix: With atrophic changes  Uterus: Mildly enlarged midline mobile nontender consistent with history of leiomyoma.  Adnexa: Without masses or tenderness    Anus and perineum: Normal   Rectovaginal: Normal sphincter tone without palpated masses or tenderness.    Assessment/Plan:  77 y.o. G42P0013 female for annual gynecologic exam.   1. Postmenopausal/atrophic genital changes.  No significant symptoms or any vaginal bleeding. 2. History of leiomyoma.  Patient has been receiving annual ultrasounds to follow her leiomyoma.  She is not having any pain or bleeding.  They remain stable if not smaller on the serial exams.  Last endometrial echo 2017 was 2.7 mm.  Both ovaries visualized and normal.  We discussed the benefits of surveillance ultrasounds for leiomyoma in an  asymptomatic patient and the issues of false positive and false negative results both from an ovarian cancer standpoint as well as other pathology.  Her exam shows uterus mildly enlarged and was described at 10 weeks by Dr. Toney Rakes at his last exam.  Certainly palpate smaller than the prior exam.  Options to forego ultrasound at this point or continue discussed and she is comfortable with stopping the ultrasounds at this point.  She will report any bleeding or other symptoms. 3. Mammography overdue and I reminded the patient to schedule.  She agrees to do so.  Breast exam normal today. 4. DEXA 2016 normal.  Recommend repeat DEXA at 5-year interval. 5. Pap smear 2010.  No Pap smear done today.  No history of significant abnormal Pap smears.  We discussed stop screening based on age per current screening guidelines and she is comfortable with this. 6. Colonoscopy never.  Second most common cancer in women reviewed.  Patient declines colonoscopy.  I encouraged her to discuss Cologuard options with her primary physician as an alternative. 7. Health maintenance.  No routine lab work done as patient does this elsewhere.  Follow-up 1 year, sooner as needed.   Anastasio Auerbach MD, 9:14 AM 04/13/2017

## 2017-05-13 ENCOUNTER — Other Ambulatory Visit: Payer: Self-pay | Admitting: Gynecology

## 2017-05-13 DIAGNOSIS — Z1231 Encounter for screening mammogram for malignant neoplasm of breast: Secondary | ICD-10-CM

## 2017-05-17 ENCOUNTER — Ambulatory Visit
Admission: RE | Admit: 2017-05-17 | Discharge: 2017-05-17 | Disposition: A | Discharge status: Home / Self Care | Payer: Medicare Other | Source: Ambulatory Visit | Attending: Gynecology | Admitting: Gynecology

## 2017-05-17 DIAGNOSIS — Z1231 Encounter for screening mammogram for malignant neoplasm of breast: Secondary | ICD-10-CM

## 2017-06-08 ENCOUNTER — Other Ambulatory Visit: Payer: Self-pay | Admitting: Cardiovascular Disease

## 2017-06-08 ENCOUNTER — Ambulatory Visit
Admission: RE | Admit: 2017-06-08 | Discharge: 2017-06-08 | Disposition: A | Discharge status: Home / Self Care | Payer: Medicare Other | Source: Ambulatory Visit | Attending: Cardiovascular Disease | Admitting: Cardiovascular Disease

## 2017-06-08 DIAGNOSIS — M25562 Pain in left knee: Secondary | ICD-10-CM

## 2017-06-08 DIAGNOSIS — M25511 Pain in right shoulder: Secondary | ICD-10-CM

## 2017-06-08 DIAGNOSIS — M25561 Pain in right knee: Secondary | ICD-10-CM

## 2017-06-10 ENCOUNTER — Encounter: Payer: Medicare Other | Admitting: Gynecology

## 2017-06-14 ENCOUNTER — Encounter (INDEPENDENT_AMBULATORY_CARE_PROVIDER_SITE_OTHER): Payer: Self-pay | Admitting: Orthopedic Surgery

## 2017-06-14 ENCOUNTER — Ambulatory Visit (INDEPENDENT_AMBULATORY_CARE_PROVIDER_SITE_OTHER): Payer: Medicare Other | Admitting: Orthopedic Surgery

## 2017-06-14 VITALS — Ht 63.0 in | Wt 175.0 lb

## 2017-06-14 DIAGNOSIS — M170 Bilateral primary osteoarthritis of knee: Secondary | ICD-10-CM | POA: Diagnosis not present

## 2017-06-14 DIAGNOSIS — M25511 Pain in right shoulder: Secondary | ICD-10-CM

## 2017-06-14 NOTE — Progress Notes (Signed)
Office Visit Note   Patient: Miranda Gonzalez           Date of Birth: 1940-07-01           MRN: [id number] Visit Date: 06/14/2017              Requested by: Dixie Dials, MD [address] [place], [place] [address] PCP: Dixie Dials, MD  Chief Complaint  Patient presents with  . Right Knee - Pain  . Left Knee - Pain  . Right Shoulder - Pain      HPI: Patient is a 77 year old woman who presents complaining of osteoarthritis bilateral knees.  Patient is also been having some pain of the right shoulder AC joint with prominence of the [facility] joint.  Patient several years ago was doing extreme kickboxing she does swimming and states she did go to a reflexogenic clinic but has not followed up with them.  Her radiographs are reviewed in the canopy system.  Assessment & Plan: Visit Diagnoses:  1. Acute pain of right shoulder   2. Bilateral primary osteoarthritis of knee     Plan: Discussed that the [facility] arthropathy is something that we could inject if it becomes more symptomatic.  Discussed that the bone spur could also be resected if necessary.  Discussed with the arthritis of her knees she does have end-stage tricompartmental arthritis and options include steroid injection versus hyaluronic acid injection versus total knee arthroplasty.  Patient states she would like to consider a total knee arthroplasty and will call when she is ready to consider surgical intervention.  Follow-Up Instructions: Return if symptoms worsen or fail to improve.   Ortho Exam  Patient is alert, oriented, no adenopathy, well-dressed, normal affect, normal respiratory effort. On examination patient has an antalgic gait.  She has crepitation with range of motion of both knees right worse than left.  She has tenderness palpation of the medial lateral joint line as well as the patellofemoral joint.  Collaterals and cruciates are stable.  Examination of the right shoulder she has a prominent AC arthropathy there  is no redness no cellulitis no signs of infection or gallop.  Imaging: No results found. No images are attached to the encounter.  Labs: No results found for: HGBA1C, ESRSEDRATE, CRP, LABURIC, REPTSTATUS, GRAMSTAIN, CULT, LABORGA   No results found for: ALBUMIN, PREALBUMIN, LABURIC  Body mass index is 31 kg/m.  Orders:  No orders of the defined types were placed in this encounter.  No orders of the defined types were placed in this encounter.    Procedures: No procedures performed  Clinical Data: No additional findings.  ROS:  All other systems negative, except as noted in the HPI. Review of Systems  Objective: Vital Signs: Ht 5\' 3"  (1.6 m)   Wt 175 lb (79.4 kg)   BMI 31.00 kg/m   Specialty Comments:  No specialty comments available.  PMFS History: Patient Active Problem List   Diagnosis Date Noted  . Mastodynia, female 06/06/2015  . External hemorrhoid 11/30/2012  . Fibroids 11/28/2012  . Vaginal atrophy 11/28/2012  . H/O vitamin D deficiency 11/28/2012  . Osteopenia 11/28/2012  . Menopause 11/28/2012   Past Medical History:  Diagnosis Date  . Fibroid uterus   . Hypertension   . Occipital neuralgia     Family History  Problem Relation Age of Onset  . Heart disease Mother   . Hypertension Mother   . Diabetes Father   . Heart disease Father   .  Diabetes Sister   . Heart disease Sister   . Hypertension Sister   . Breast cancer Sister 63  . Cancer Sister        Lung  . Diabetes Brother   . Thyroid disease Daughter   . Migraines Daughter   . Cancer Maternal Grandmother        breast     Past Surgical History:  Procedure Laterality Date  . FOOT SURGERY Bilateral   . TUBAL LIGATION     Social History   Occupational History  . Not on file  Tobacco Use  . Smoking status: Former Research scientist (life sciences)  . Smokeless tobacco: Never Used  Substance and Sexual Activity  . Alcohol use: Yes    Alcohol/week: 0.0 oz    Comment: occasional  . Drug use: No  .  Sexual activity: Yes    Partners: Male    Comment: 1st intercourse 77 yo-More than 5 partners

## 2018-04-21 ENCOUNTER — Encounter: Payer: Medicare Other | Admitting: Gynecology

## 2018-06-01 ENCOUNTER — Other Ambulatory Visit: Payer: Self-pay

## 2018-06-02 ENCOUNTER — Ambulatory Visit (INDEPENDENT_AMBULATORY_CARE_PROVIDER_SITE_OTHER): Payer: Medicare Other | Admitting: Gynecology

## 2018-06-02 ENCOUNTER — Encounter: Payer: Self-pay | Admitting: Gynecology

## 2018-06-02 VITALS — BP 124/84 | Ht 62.0 in | Wt 177.0 lb

## 2018-06-02 DIAGNOSIS — Z01419 Encounter for gynecological examination (general) (routine) without abnormal findings: Secondary | ICD-10-CM | POA: Diagnosis not present

## 2018-06-02 DIAGNOSIS — D251 Intramural leiomyoma of uterus: Secondary | ICD-10-CM

## 2018-06-02 DIAGNOSIS — N952 Postmenopausal atrophic vaginitis: Secondary | ICD-10-CM | POA: Diagnosis not present

## 2018-06-02 NOTE — Patient Instructions (Signed)
Schedule your mammogram  Follow-up for the bone density as scheduled

## 2018-06-02 NOTE — Progress Notes (Signed)
    RODERICA CATHELL February 09, 1940 [id number]        77 y.o.  G4P0013 for annual gynecologic exam.  Without gynecologic complaints  Past medical history,surgical history, problem list, medications, allergies, family history and social history were all reviewed and documented as reviewed in the EPIC chart.  ROS:  Performed with pertinent positives and negatives included in the history, assessment and plan.   Additional significant findings : None   Exam: Caryn Bee assistant Vitals:   06/02/18 1402  BP: 124/84  Weight: 177 lb (80.3 kg)  Height: 5\' 2"  (1.575 m)   Body mass index is 32.37 kg/m.  General appearance:  Normal affect, orientation and appearance. Skin: Grossly normal HEENT: Without gross lesions.  No cervical or supraclavicular adenopathy. Thyroid normal.  Lungs:  Clear without wheezing, rales or rhonchi Cardiac: RR, without RMG Abdominal:  Soft, nontender, without masses, guarding, rebound, organomegaly or hernia Breasts:  Examined lying and sitting without masses, retractions, discharge or axillary adenopathy. Pelvic:  Ext, BUS, Vagina: With atrophic changes  Cervix: With atrophic changes  Uterus: Mildly enlarged, midline and mobile nontender   Adnexa: Without masses or tenderness    Anus and perineum: Normal   Rectovaginal: Normal sphincter tone without palpated masses or tenderness.    Assessment/Plan:  78 y.o. G37P0013 female for annual gynecologic exam.  1. History of leiomyoma.  Exam stable from prior exams.  Patient without symptoms.  We will continue to monitor with annual exams. 2. Postmenopausal.  No significant menopausal symptoms or any vaginal bleeding. 3. Mammography due now and patient is in the process of scheduling.  Breast exam normal today. 4. Colonoscopy 2020.  Repeat at their recommended interval. 5. Pap smear 2010.  No Pap smear done today.  No history of abnormal Pap smears.  We both agree to stop screening per current screening guidelines  based on age. 6. DEXA 2016 normal.  Recommend DEXA this coming summer and she agrees to schedule and follow-up for this. 7. Health maintenance.  No routine lab work done as patient does this elsewhere.  Follow-up 1 year, sooner as needed.   Anastasio Auerbach MD, 2:28 PM 06/02/2018

## 2018-08-24 ENCOUNTER — Other Ambulatory Visit: Payer: Self-pay | Admitting: Gynecology

## 2018-08-24 DIAGNOSIS — Z1231 Encounter for screening mammogram for malignant neoplasm of breast: Secondary | ICD-10-CM

## 2018-08-25 ENCOUNTER — Other Ambulatory Visit: Payer: Self-pay | Admitting: Gynecology

## 2018-08-25 DIAGNOSIS — E2839 Other primary ovarian failure: Secondary | ICD-10-CM

## 2018-09-08 ENCOUNTER — Ambulatory Visit
Admission: RE | Admit: 2018-09-08 | Discharge: 2018-09-08 | Disposition: A | Discharge status: Home / Self Care | Payer: Medicare Other | Source: Ambulatory Visit | Attending: Cardiovascular Disease | Admitting: Cardiovascular Disease

## 2018-09-08 ENCOUNTER — Other Ambulatory Visit: Payer: Self-pay | Admitting: Cardiovascular Disease

## 2018-09-08 DIAGNOSIS — M25562 Pain in left knee: Secondary | ICD-10-CM

## 2018-09-23 ENCOUNTER — Ambulatory Visit: Payer: Self-pay

## 2018-09-23 ENCOUNTER — Encounter: Payer: Self-pay | Admitting: Family

## 2018-09-23 ENCOUNTER — Other Ambulatory Visit: Payer: Self-pay

## 2018-09-23 ENCOUNTER — Ambulatory Visit (INDEPENDENT_AMBULATORY_CARE_PROVIDER_SITE_OTHER): Payer: Medicare Other | Admitting: Family

## 2018-09-23 VITALS — Ht 62.0 in | Wt 177.0 lb

## 2018-09-23 DIAGNOSIS — M79671 Pain in right foot: Secondary | ICD-10-CM | POA: Diagnosis not present

## 2018-09-23 DIAGNOSIS — M79672 Pain in left foot: Secondary | ICD-10-CM | POA: Diagnosis not present

## 2018-09-27 NOTE — Progress Notes (Signed)
Office Visit Note   Patient: Miranda Gonzalez           Date of Birth: 1940-04-23           MRN: [id number] Visit Date: 09/23/2018              Requested by: Dixie Dials, MD [address] [place],  [place] [address] PCP: Dixie Dials, MD  Chief Complaint  Patient presents with  . Left Foot - Pain      HPI: The patient is a 78 year old woman who presents today complaining of bilateral foot pain however her main complaint is some waxing and waning edema to the left lower extremity that she describes as mild to moderate.  This began a number of weeks ago there was no associated injury she has had no change in her medication has begun having puffy swelling to her left lower leg and foot this resolves overnight.  It is more irritating than painful.  She has been seen by her primary care has been reassured that she does not have any circulatory issues.  Does not have a blood clot.  However it is distressing that she does not have a reason for the swelling.  She does have have a previous history of multiple surgeries to her feet on her left foot she has had a bunionectomy as well as some hammertoes have developed.  She states the bunionectomy did not work well this was about 30 years ago.  Has significant varus deformity of her great toenail she states that she has pain in her great toe and over the dorsum of her foot with ambulation if she walks flat-footed or barefooted.  States she must wear heels every day to less than pain over her anterior compartment of her shin as well as the dorsum of her foot.  The crossover deformity of some lesser toes on her right foot as well as the hammertoes and issue with her great toe on her left are quite distressing to her.  However she is on interested today in discussing further foot and toe surgeries.  Assessment & Plan: Visit Diagnoses:  1. Bilateral foot pain     Plan: Reassurance provided.  The patient has a strong dorsalis pedis pulse this  is likely venous swelling in her left lower leg.  Discussed that this is unrelated to her bunion surgery or hammertoes on the left.  Discussed surgical options for repair of her great toe, straightening it.  Patient uninterested at this time feel that is is a good choice.  She will continue with her comfortable shoewear discussed compression garments for her swelling.  Elevation.  Follow-Up Instructions: Return if symptoms worsen or fail to improve.   Ortho Exam  Patient is alert, oriented, no adenopathy, well-dressed, normal affect, normal respiratory effort. On examination of the left lower extremity there is very mild edema today.  There is no pitting.  There is no erythema no color change.  Does have a palpable dorsalis pedis pulse.  She does have hammertoes of the  third and fourth toes on the left this is flexible.  The great toe is deviated inward.  On examination of her right foot there is a crossover deformity over the second over the third toes there is no edema to the left right lower extremity no erythema no skin changes  Imaging: No results found. No images are attached to the encounter.  Labs: No results found for: HGBA1C, ESRSEDRATE, CRP, LABURIC, REPTSTATUS, GRAMSTAIN, CULT,  LABORGA   No results found for: ALBUMIN, PREALBUMIN, LABURIC  No results found for: MG No results found for: VD25OH  No results found for: PREALBUMIN No flowsheet data found.   Body mass index is 32.37 kg/m.  Orders:  Orders Placed This Encounter  Procedures  . XR Foot 2 Views Right  . XR Foot 2 Views Left   No orders of the defined types were placed in this encounter.    Procedures: No procedures performed  Clinical Data: No additional findings.  ROS:  All other systems negative, except as noted in the HPI. Review of Systems  Constitutional: Negative for chills and fever.  Cardiovascular: Positive for leg swelling.  Musculoskeletal: Positive for arthralgias and myalgias.   Neurological: Negative for weakness.    Objective: Vital Signs: Ht 5\' 2"  (1.575 m)   Wt 177 lb (80.3 kg)   BMI 32.37 kg/m   Specialty Comments:  No specialty comments available.  PMFS History: Patient Active Problem List   Diagnosis Date Noted  . Mastodynia, female 06/06/2015  . External hemorrhoid 11/30/2012  . Fibroids 11/28/2012  . Vaginal atrophy 11/28/2012  . H/O vitamin D deficiency 11/28/2012  . Osteopenia 11/28/2012  . Menopause 11/28/2012   Past Medical History:  Diagnosis Date  . Fibroid uterus   . Hypertension   . Occipital neuralgia     Family History  Problem Relation Age of Onset  . Heart disease Mother   . Hypertension Mother   . Diabetes Father   . Heart disease Father   . Diabetes Sister   . Heart disease Sister   . Hypertension Sister   . Breast cancer Sister 76  . Cancer Sister        Lung  . Diabetes Brother   . Thyroid disease Daughter   . Migraines Daughter   . Cancer Maternal Grandmother        breast     Past Surgical History:  Procedure Laterality Date  . CATARACT EXTRACTION     Bilateral  . FOOT SURGERY Bilateral   . TUBAL LIGATION     Social History   Occupational History  . Not on file  Tobacco Use  . Smoking status: Former Research scientist (life sciences)  . Smokeless tobacco: Never Used  Substance and Sexual Activity  . Alcohol use: Yes    Alcohol/week: 0.0 standard drinks    Comment: occasional  . Drug use: No  . Sexual activity: Yes    Partners: Male    Comment: 1st intercourse 78 yo-More than 5 partners

## 2018-10-06 DIAGNOSIS — M8580 Other specified disorders of bone density and structure, unspecified site: Secondary | ICD-10-CM

## 2018-10-06 HISTORY — DX: Other specified disorders of bone density and structure, unspecified site: M85.80

## 2018-10-07 IMAGING — DX DG FOOT COMPLETE 3+V*L*
3 series · 3 of 3 positions shown · non-contrast
Comparison: 08/28/2013 left foot radiographs

CLINICAL DATA: Left foot pain after recent injury.

EXAM:
LEFT FOOT - COMPLETE 3+ VIEW

[foot ap]
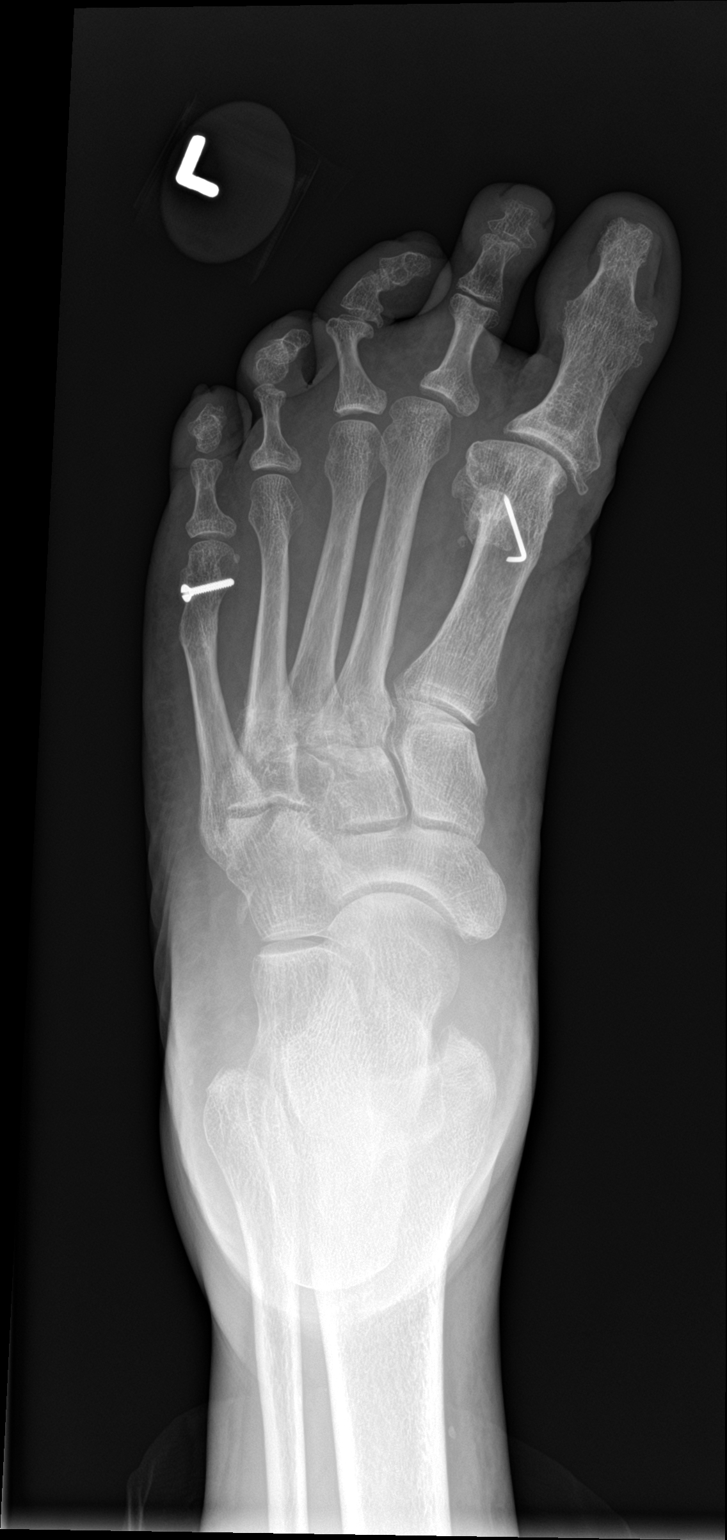

[foot obl]
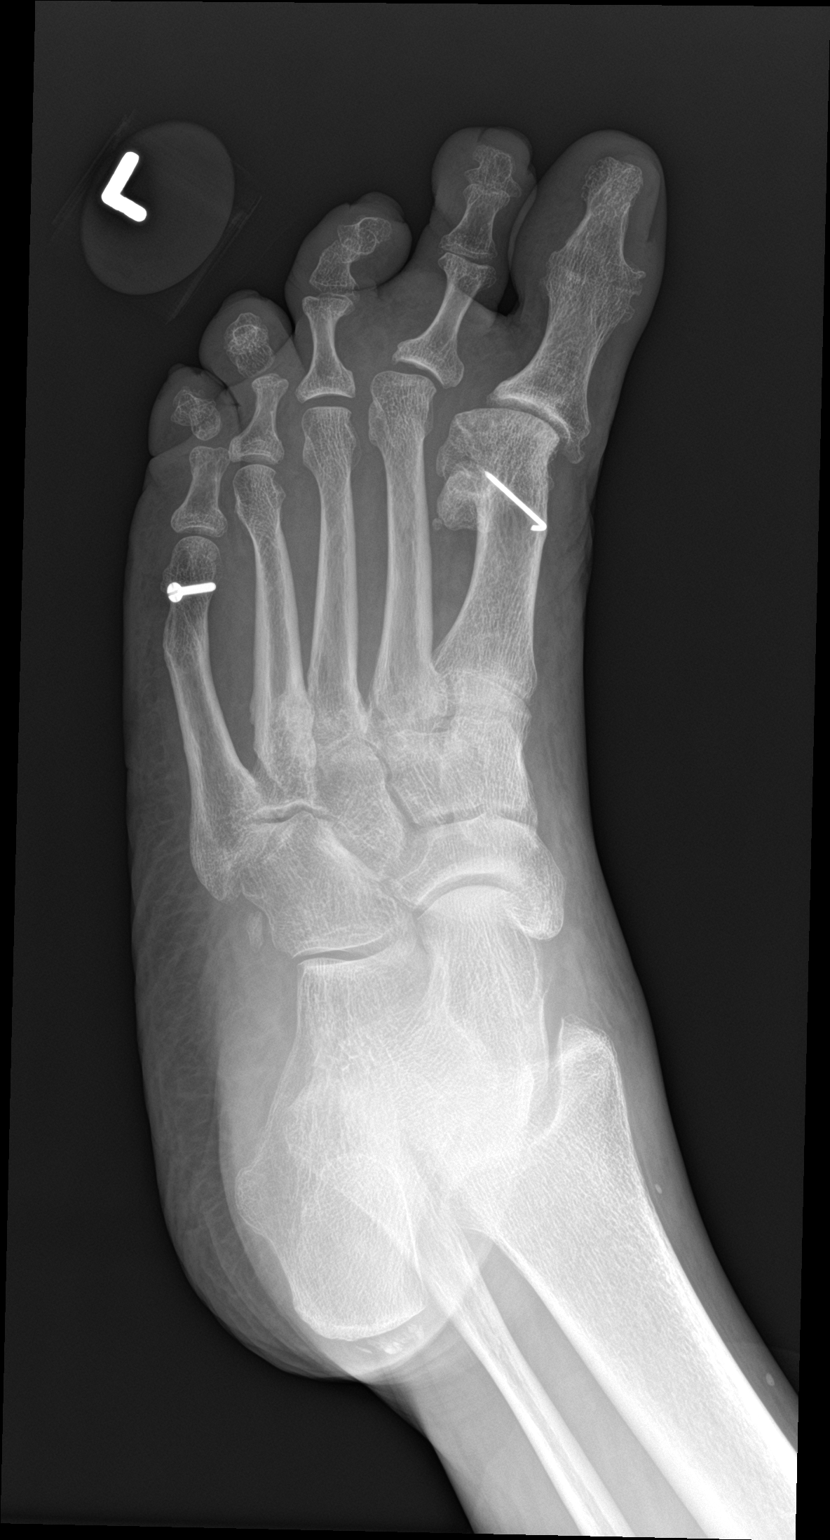

[foot lat]
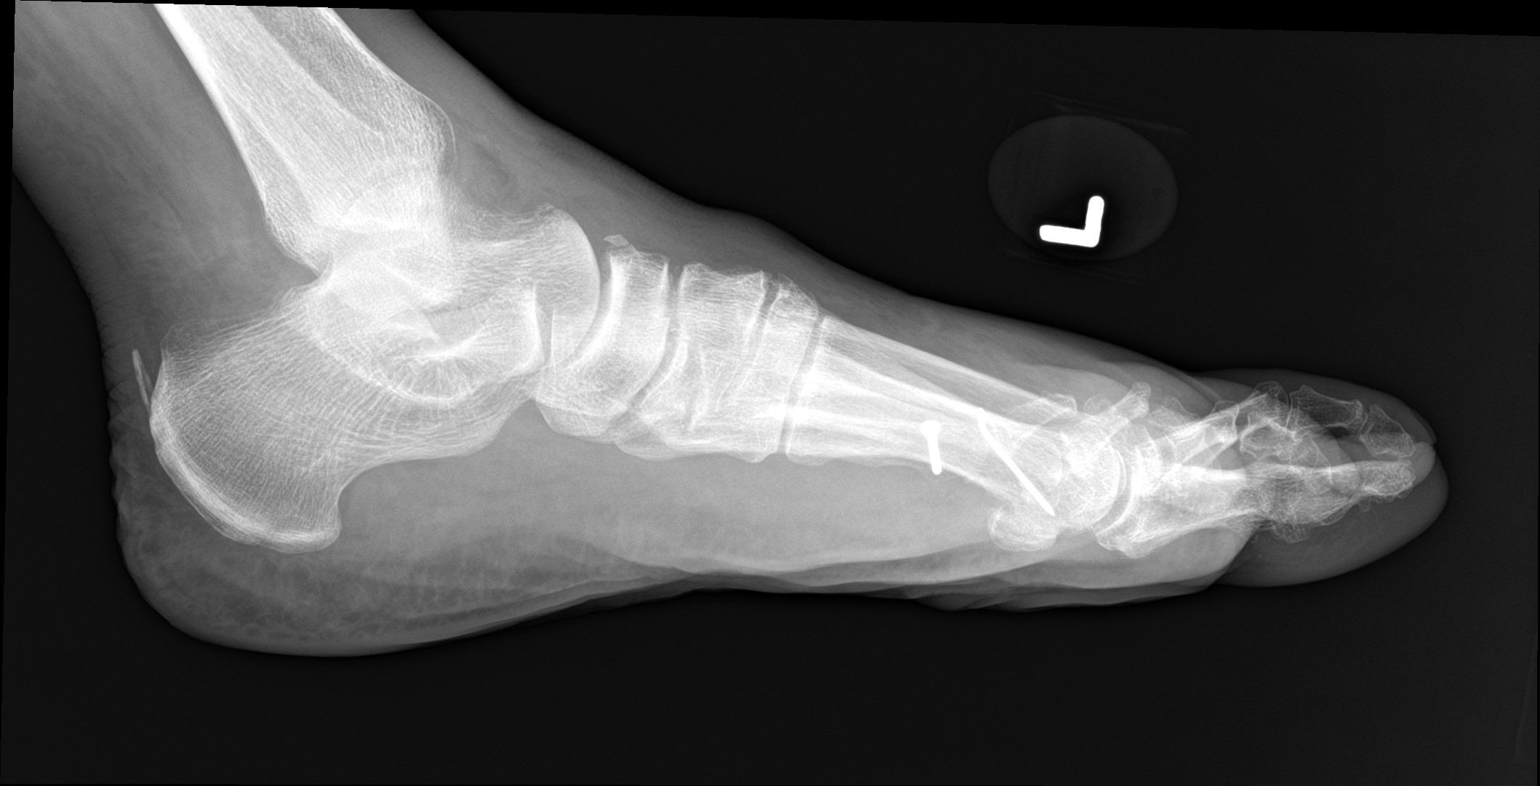

[3 of 3 positions shown; findings below may reference images not displayed]

FINDINGS: There is a tiny nondisplaced avulsion fracture at the dorsal distal
talus with overlying soft tissue swelling. No additional fracture.
No dislocation. Stable screw in the distal left fifth metatarsal
with associated healed deformity. Stable wire in the distal left
first metatarsal with healed deformity. No evidence of hardware
fracture or loosening. Stable moderate first metatarsal-phalangeal
joint and proximal interphalangeal joint left third toe
osteoarthritis. Stable degenerative changes in the dorsal tarsal
joints. Moderate Achilles left calcaneal spur, increased.
IMPRESSION: 1. Tiny nondisplaced avulsion fracture at the dorsal distal talus
with overlying soft tissue swelling.
2. Stable postsurgical changes in the first and fifth metatarsals
with polyarticular left foot osteoarthritis.

## 2018-10-13 ENCOUNTER — Encounter: Payer: Self-pay | Admitting: Gynecology

## 2018-11-04 ENCOUNTER — Ambulatory Visit
Admission: RE | Admit: 2018-11-04 | Discharge: 2018-11-04 | Disposition: A | Discharge status: Home / Self Care | Payer: Medicare Other | Source: Ambulatory Visit | Attending: Gynecology | Admitting: Gynecology

## 2018-11-04 ENCOUNTER — Encounter: Payer: Self-pay | Admitting: Gynecology

## 2018-11-04 ENCOUNTER — Other Ambulatory Visit: Payer: Self-pay

## 2018-11-04 DIAGNOSIS — E2839 Other primary ovarian failure: Secondary | ICD-10-CM

## 2018-11-04 DIAGNOSIS — Z1231 Encounter for screening mammogram for malignant neoplasm of breast: Secondary | ICD-10-CM

## 2019-02-23 ENCOUNTER — Ambulatory Visit: Payer: Medicare PPO

## 2019-02-27 ENCOUNTER — Ambulatory Visit: Payer: Medicare PPO | Attending: Family

## 2019-02-27 DIAGNOSIS — Z23 Encounter for immunization: Secondary | ICD-10-CM | POA: Insufficient documentation

## 2019-02-27 NOTE — Progress Notes (Signed)
   Covid-19 Vaccination Clinic  Name:  Miranda Gonzalez    MRN: [id number] DOB: June 24, 1940  02/27/2019  [name] was observed post Covid-19 immunization for 15 minutes without incidence. She was provided with Vaccine Information Sheet and instruction to access the V-Safe system.   [name] was instructed to call 911 with any severe reactions post vaccine: Marland Kitchen Difficulty breathing  . Swelling of your face and throat  . A fast heartbeat  . A bad rash all over your body  . Dizziness and weakness    Immunizations Administered    Name Date Dose VIS Date Route   Moderna COVID-19 Vaccine 02/27/2019  2:31 PM 0.5 mL 12/06/2018 Intramuscular   Manufacturer: Moderna   Lot: [id number]   [id number]

## 2019-04-04 ENCOUNTER — Ambulatory Visit: Payer: Medicare PPO | Attending: Family

## 2019-04-04 DIAGNOSIS — Z23 Encounter for immunization: Secondary | ICD-10-CM

## 2019-04-04 NOTE — Progress Notes (Signed)
   Covid-19 Vaccination Clinic  Name:  Miranda Gonzalez    MRN: [id number] DOB: 1941/01/02  04/04/2019  Miranda Gonzalez was observed post Covid-19 immunization for 15 minutes without incident. She was provided with Vaccine Information Sheet and instruction to access the V-Safe system.   Miranda Gonzalez was instructed to call 911 with any severe reactions post vaccine: Marland Kitchen Difficulty breathing  . Swelling of face and throat  . A fast heartbeat  . A bad rash all over body  . Dizziness and weakness   Immunizations Administered    Name Date Dose VIS Date Route   Moderna COVID-19 Vaccine 04/04/2019  2:56 PM 0.5 mL 12/06/2018 Intramuscular   Manufacturer: Moderna   Lot: [id number]   [id number]

## 2019-06-08 ENCOUNTER — Other Ambulatory Visit: Payer: Self-pay

## 2019-06-09 ENCOUNTER — Encounter: Payer: Self-pay | Admitting: Obstetrics and Gynecology

## 2019-06-09 ENCOUNTER — Ambulatory Visit: Payer: Medicare PPO | Admitting: Obstetrics and Gynecology

## 2019-06-09 VITALS — BP 122/78 | Ht 62.0 in | Wt 176.0 lb

## 2019-06-09 DIAGNOSIS — M8588 Other specified disorders of bone density and structure, other site: Secondary | ICD-10-CM | POA: Diagnosis not present

## 2019-06-09 DIAGNOSIS — D251 Intramural leiomyoma of uterus: Secondary | ICD-10-CM | POA: Diagnosis not present

## 2019-06-09 DIAGNOSIS — Z01419 Encounter for gynecological examination (general) (routine) without abnormal findings: Secondary | ICD-10-CM | POA: Diagnosis not present

## 2019-06-09 DIAGNOSIS — Z9189 Other specified personal risk factors, not elsewhere classified: Secondary | ICD-10-CM

## 2019-06-09 NOTE — Progress Notes (Signed)
Miranda Gonzalez 1940-09-02 [id number]  SUBJECTIVE:  79 y.o. [id number] female for annual routine gynecologic exam. She has no gynecologic concerns. Requests a pelvic ultrasound for history of fibroids.  Current Outpatient Medications  Medication Sig Dispense Refill  . ACCU-CHEK AVIVA PLUS test strip See admin instructions.  3  . amLODipine (NORVASC) 5 MG tablet Take 5 mg by mouth daily.    . Ascorbic Acid (VITAMIN C PO) Take by mouth.    Marland Kitchen atenolol (TENORMIN) 50 MG tablet Take 50 mg by mouth 2 (two) times daily.    . Calcium Carbonate-Vitamin D (CALTRATE 600+D) 600-400 MG-UNIT per tablet Take 1 tablet by mouth daily.    . hydrochlorothiazide (HYDRODIURIL) 25 MG tablet Take 25 mg by mouth daily.    Marland Kitchen MAGNESIUM PO Take by mouth.    . Potassium 75 MG TABS Take by mouth.     No current facility-administered medications for this visit.   Allergies: Grapeseed extract [nutritional supplements], Phenergan [promethazine hcl], and Promethazine  No LMP recorded. Patient is postmenopausal.  Past medical history,surgical history, problem list, medications, allergies, family history and social history were all reviewed and documented as reviewed in the EPIC chart.  ROS:  Feeling well. No dyspnea or chest pain on exertion.  No abdominal pain, change in bowel habits, black or bloody stools.  No urinary tract symptoms. GYN ROS: no abnormal bleeding, pelvic pain or discharge, no breast pain or new or enlarging lumps on self exam. No neurological complaints.   OBJECTIVE:  BP 122/78   Ht 5\' 2"  (1.575 m)   Wt 176 lb (79.8 kg)   BMI 32.19 kg/m  The patient appears well, alert, oriented x 3, in no distress. ENT normal.  Neck supple. No cervical or supraclavicular adenopathy or thyromegaly.  Lungs are clear, good air entry, no wheezes, rhonchi or rales. S1 and S2 normal, no murmurs, regular rate and rhythm.  Abdomen soft without tenderness, guarding, mass or organomegaly.  Neurological is normal, no  focal findings.  BREAST EXAM: breasts appear normal, no suspicious masses, no skin or nipple changes or axillary nodes  PELVIC EXAM: VULVA: normal appearing vulva with no masses, tenderness or lesions, VAGINA: normal appearing vagina with normal color and discharge, no lesions, CERVIX: normal appearing cervix without discharge or lesions, UTERUS: uterus is mildly enlarged about 8 to 10-week, shape, consistency and nontender, ADNEXA: normal adnexa in size, nontender and no masses  Chaperone: Caryn Bee present during the examination  ASSESSMENT:  79 y.o. [id number] here for annual gynecologic exam  PLAN:   1. Postmenopausal.  No significant hot flashes, night sweats.  No vaginal bleeding. 2. Pap smear 2010.  No significant history of abnormal Pap smears.  Further screening not recommended based on age criteria. 3. Mammogram 10/2018.  Normal breast exam today.  She is reminded to schedule an annual mammogram this year when due. 4.  History of leiomyoma.  Mild enlargement of uterus as noted previously.  No symptoms but the patient does request a screening pelvic ultrasound (last one in 2017) which we explained is of limited value in the absence of symptoms and stable bimanual exam, but she really is hoping to get this done, so I did order the test for her and she can schedule as desired. 5. Colonoscopy 02/2018.  Recommended that she follow up at the recommended interval. 6. Osteopenia. DEXA 10/2018 T score -1.8, FRAX 5.8% / 1.4%.  Optimizing calcium and vitamin D intake.  Next DEXA recommended 2022. 7. Health maintenance.  No labs today as she normally has these completed with her primary care provider.     Return annually or sooner, prn.  Joseph Pierini MD 06/09/19

## 2019-06-28 ENCOUNTER — Other Ambulatory Visit: Payer: Self-pay

## 2019-06-29 ENCOUNTER — Encounter: Payer: Self-pay | Admitting: Obstetrics and Gynecology

## 2019-06-29 ENCOUNTER — Ambulatory Visit: Payer: Medicare PPO | Admitting: Obstetrics and Gynecology

## 2019-06-29 ENCOUNTER — Ambulatory Visit (INDEPENDENT_AMBULATORY_CARE_PROVIDER_SITE_OTHER): Payer: Medicare PPO

## 2019-06-29 VITALS — BP 118/76

## 2019-06-29 DIAGNOSIS — N854 Malposition of uterus: Secondary | ICD-10-CM | POA: Diagnosis not present

## 2019-06-29 DIAGNOSIS — D259 Leiomyoma of uterus, unspecified: Secondary | ICD-10-CM

## 2019-06-29 DIAGNOSIS — D251 Intramural leiomyoma of uterus: Secondary | ICD-10-CM

## 2019-06-29 DIAGNOSIS — D252 Subserosal leiomyoma of uterus: Secondary | ICD-10-CM

## 2019-06-29 NOTE — Progress Notes (Signed)
   Miranda Gonzalez 12/29/1940 [id number]  SUBJECTIVE:  78 y.o. [id number] female presents for a pelvic ultrasound to monitor her fibroid uterus.  Asymptomatic.  No vaginal bleeding.  Current Outpatient Medications  Medication Sig Dispense Refill  . ACCU-CHEK AVIVA PLUS test strip See admin instructions.  3  . amLODipine (NORVASC) 5 MG tablet Take 5 mg by mouth daily.    . Ascorbic Acid (VITAMIN C PO) Take by mouth.    Marland Kitchen atenolol (TENORMIN) 50 MG tablet Take 50 mg by mouth 2 (two) times daily.    . Calcium Carbonate-Vitamin D (CALTRATE 600+D) 600-400 MG-UNIT per tablet Take 1 tablet by mouth daily.    . hydrochlorothiazide (HYDRODIURIL) 25 MG tablet Take 25 mg by mouth daily.    Marland Kitchen MAGNESIUM PO Take by mouth.    . Potassium 75 MG TABS Take by mouth.     No current facility-administered medications for this visit.   Allergies: Grapeseed extract [nutritional supplements], Phenergan [promethazine hcl], and Promethazine  No LMP recorded. Patient is postmenopausal.  Past medical history,surgical history, problem list, medications, allergies, family history and social history were all reviewed and documented as reviewed in the EPIC chart.   OBJECTIVE:  There were no vitals taken for this visit. The patient appears well, alert, oriented, in no distress. Pelvic ultrasound Uterus 8.9 x 5.0 x 5.7 cm, anteverted Endometrium 3.3 mm thickness Multiple fibroids noted, several calcifications, subserosal and intramural location.  Largest fibroid measures 6.3 x 4.2 cm.  Multiple other fibroids include 3.2 x 2.5 cm, 2.9 x 4.0 cm, 3.1 x 2.6 cm, 2.1 x 1.8 cm 1.0 x 1.1 cm, 1.3 x 1.0 cm. Right ovary not well seen.  Left ovary is normal.  No adnexal masses.  No free fluid.  Chaperone: Caryn Bee present during the examination   ASSESSMENT:  79 y.o. [phone] here for monitoring of fibroid uterus with ultrasound  PLAN:  The patient is reassured that the fibroids on her uterus appear stable in size.  She  is asymptomatic.  Would recommend continuing yearly pelvic exams for ongoing surveillance.   Joseph Pierini MD 06/29/19

## 2019-08-08 ENCOUNTER — Ambulatory Visit: Payer: Medicare PPO | Admitting: Orthopedic Surgery

## 2019-08-15 ENCOUNTER — Encounter: Payer: Self-pay | Admitting: Orthopedic Surgery

## 2019-08-15 ENCOUNTER — Ambulatory Visit: Payer: Medicare PPO | Admitting: Orthopedic Surgery

## 2019-08-15 ENCOUNTER — Ambulatory Visit: Payer: Self-pay

## 2019-08-15 VITALS — Ht 62.0 in | Wt 176.0 lb

## 2019-08-15 DIAGNOSIS — M170 Bilateral primary osteoarthritis of knee: Secondary | ICD-10-CM | POA: Diagnosis not present

## 2019-08-15 DIAGNOSIS — M25561 Pain in right knee: Secondary | ICD-10-CM

## 2019-08-15 DIAGNOSIS — M25562 Pain in left knee: Secondary | ICD-10-CM

## 2019-08-15 DIAGNOSIS — G8929 Other chronic pain: Secondary | ICD-10-CM

## 2019-08-16 ENCOUNTER — Encounter: Payer: Self-pay | Admitting: Orthopedic Surgery

## 2019-08-16 DIAGNOSIS — M170 Bilateral primary osteoarthritis of knee: Secondary | ICD-10-CM | POA: Insufficient documentation

## 2019-08-16 NOTE — Progress Notes (Signed)
Office Visit Note   Patient: Miranda Gonzalez           Date of Birth: [birth year]           MRN: [id number] Visit Date: 08/15/2019              Requested by: Dixie Dials, MD [address] [place],  [place] [address] PCP: Dixie Dials, MD  Chief Complaint  Patient presents with  . Left Knee - Pain  . Right Knee - Pain  . Right Foot - Pain  . Left Foot - Pain      HPI: The patient is a 79 year old woman who presents today complaining of bilateral knee pain.  This is been a longstanding issue for many years.  She states her right knee does hurt worse than her left she complains of associated swelling this waxes and wanes she has pain with flexion remodeling swelling is worse.  She has bilateral lateral joint line pain which is worse than the medial joint line.  She does have pain and fullness in her popliteal fossa bilaterally.  Worse pain descending stairs some popping with full flexion extension as well.  She feels strongly deferring a total knee arthroplasty.  Nervous about having such a major surgery at her age.  She is also nervous and has had heard from many friends that Depo-Medrol injections of the knees have not been effective.  She is interested in hearing about supplemental injections today.  Assessment & Plan: Visit Diagnoses:  1. Bilateral primary osteoarthritis of knee   2. Chronic pain of both knees     Plan: Deferred Depo-Medrol injection of her knees at this time.  Will move forward with prior authorization of supplemental injections bilateral knees for osteoarthritis.  We will call her for an appointment once these are approved.  Follow-Up Instructions: Return supplemental injections.   Right Knee Exam   Muscle Strength  The patient has normal right knee strength.  Tenderness  The patient is experiencing tenderness in the lateral joint line.  Range of Motion  The patient has normal right knee ROM.  Tests  Varus: negative Valgus:  negative  Other  Erythema: absent Swelling: moderate   Left Knee Exam   Tenderness  The patient is experiencing tenderness in the lateral joint line.  Range of Motion  The patient has normal left knee ROM.  Tests  Varus: negative Valgus: negative  Other  Erythema: absent Swelling: moderate      Patient is alert, oriented, no adenopathy, well-dressed, normal affect, normal respiratory effort.   Imaging: No results found. No images are attached to the encounter.  Left knee 06/08/2017: IMPRESSION: Tricompartmental degenerative changes, severe in the patellofemoral and LATERAL compartments.  Posterior calcifications likely representing calcified loose bodies/osteochondromas within a Baker's cyst.  Right knee 06/08/17: IMPRESSION: Tricompartmental degenerative changes, severe in the patellofemoral compartment and moderate to severe in the LATERAL compartment.  No evidence of acute abnormality.  Labs: No results found for: HGBA1C, ESRSEDRATE, CRP, LABURIC, REPTSTATUS, GRAMSTAIN, CULT, LABORGA   No results found for: ALBUMIN, PREALBUMIN, LABURIC  No results found for: MG No results found for: VD25OH  No results found for: PREALBUMIN No flowsheet data found.   Body mass index is 32.19 kg/m.  Orders:  Orders Placed This Encounter  Procedures  . XR Knee 1-2 Views Right  . XR Knee 1-2 Views Left   No orders of the defined types were placed in this encounter.    Procedures: No procedures  performed  Clinical Data: No additional findings.  ROS:  All other systems negative, except as noted in the HPI. Review of Systems  Constitutional: Negative for chills and fever.  Cardiovascular: Negative for leg swelling.  Musculoskeletal: Positive for arthralgias and joint swelling.    Objective: Vital Signs: Ht 5\' 2"  (1.575 m)   Wt 176 lb (79.8 kg)   BMI 32.19 kg/m   Specialty Comments:  No specialty comments available.  PMFS History: Patient  Active Problem List   Diagnosis Date Noted  . Bilateral primary osteoarthritis of knee 08/16/2019  . Mastodynia, female 06/06/2015  . External hemorrhoid 11/30/2012  . Fibroids 11/28/2012  . Vaginal atrophy 11/28/2012  . H/O vitamin D deficiency 11/28/2012  . Osteopenia 11/28/2012  . Menopause 11/28/2012   Past Medical History:  Diagnosis Date  . Fibroid uterus   . Hypertension   . Occipital neuralgia   . Osteopenia 10/2018   T score -1.8 FRAX 5.8% / 1.4%    Family History  Problem Relation Age of Onset  . Heart disease Mother   . Hypertension Mother   . Diabetes Father   . Heart disease Father   . Diabetes Sister   . Heart disease Sister   . Hypertension Sister   . Breast cancer Sister 21  . Cancer Sister        Lung  . Diabetes Brother   . Thyroid disease Daughter   . Migraines Daughter   . Cancer Maternal Grandmother        breast     Past Surgical History:  Procedure Laterality Date  . CATARACT EXTRACTION     Bilateral  . FOOT SURGERY Bilateral   . TUBAL LIGATION     Social History   Occupational History  . Not on file  Tobacco Use  . Smoking status: Former Research scientist (life sciences)  . Smokeless tobacco: Never Used  Vaping Use  . Vaping Use: Never used  Substance and Sexual Activity  . Alcohol use: Yes    Alcohol/week: 7.0 standard drinks    Types: 7 Glasses of wine per week  . Drug use: No  . Sexual activity: Not Currently    Partners: Male    Comment: 1st intercourse 79 yo-More than 5 partners

## 2019-08-17 ENCOUNTER — Telehealth: Payer: Self-pay

## 2019-08-17 NOTE — Telephone Encounter (Signed)
Submitted VOB, Monovisc, bilateral knee. 

## 2019-08-17 NOTE — Telephone Encounter (Signed)
Noted  

## 2019-08-17 NOTE — Telephone Encounter (Signed)
-----   Message from Suzan Slick, NP sent at 08/16/2019  3:22 PM EDT ----- Please get prior auth for supplemental injection bilateral knee oa

## 2019-08-21 ENCOUNTER — Telehealth: Payer: Self-pay

## 2019-08-21 NOTE — Telephone Encounter (Signed)
Called and left a VM for patient to call back to schedule an appointment with Dr. Cherylynn Ridges, or Bevely Palmer for gel injection.  Approved, Monovisc, bilateral knee. [place] Patient is covered at 100% through her insurance. May have a Co-pay of $40.00? PA required PA Approval# [id number] Valid 08/21/2019- 02/21/2020

## 2019-09-19 ENCOUNTER — Ambulatory Visit (INDEPENDENT_AMBULATORY_CARE_PROVIDER_SITE_OTHER): Payer: Medicare PPO | Admitting: Family

## 2019-09-19 ENCOUNTER — Encounter: Payer: Self-pay | Admitting: Family

## 2019-09-19 VITALS — Ht 62.0 in | Wt 176.0 lb

## 2019-09-19 DIAGNOSIS — M170 Bilateral primary osteoarthritis of knee: Secondary | ICD-10-CM | POA: Diagnosis not present

## 2019-09-19 NOTE — Progress Notes (Signed)
Office Visit Note   Patient: Miranda Gonzalez           Date of Birth: 06/06/1940           MRN: [id number] Visit Date: 09/19/2019              Requested by: Miranda Dials, MD [address] [place],  [place] [address] PCP: Miranda Dials, MD  Chief Complaint  Patient presents with  . Right Knee - Follow-up    Bilateral knee injections monovisc buy and bill   . Left Knee - Follow-up      HPI: The patient is a 79 year old woman who presents today for planned bilateral knee Monovisc injections.  She is now having worsening or change in her symptoms  Assessment & Plan: Visit Diagnoses: No diagnosis found.  Plan: Bilateral Monovisc injections today.  Patient tolerated this well.  She will follow-up on an as-needed basis.  Follow-Up Instructions: Return if symptoms worsen or fail to improve.   Ortho Exam  Patient is alert, oriented, no adenopathy, well-dressed, normal affect, normal respiratory effort.   Imaging: No results found. No images are attached to the encounter.  Labs: No results found for: HGBA1C, ESRSEDRATE, CRP, LABURIC, REPTSTATUS, GRAMSTAIN, CULT, LABORGA   No results found for: ALBUMIN, PREALBUMIN, LABURIC  No results found for: MG No results found for: VD25OH  No results found for: PREALBUMIN No flowsheet data found.   Body mass index is 32.19 kg/m.  Orders:  No orders of the defined types were placed in this encounter.  No orders of the defined types were placed in this encounter.    Procedures: Large Joint Inj: bilateral knee on 09/19/2019 8:38 PM Indications: pain Details: 18 G 1.5 in needle, anteromedial approach Medications (Right): 1 mL lidocaine 1 %; 88 mg Hyaluronan 88 MG/4ML Medications (Left): 1 mL lidocaine 1 %; 88 mg Hyaluronan 88 MG/4ML Consent was given by the patient.      Clinical Data: No additional findings.  ROS:  All other systems negative, except as noted in the HPI. Review of  Systems  Objective: Vital Signs: Ht 5\' 2"  (1.575 m)   Wt 176 lb (79.8 kg)   BMI 32.19 kg/m   Specialty Comments:  No specialty comments available.  PMFS History: Patient Active Problem List   Diagnosis Date Noted  . Bilateral primary osteoarthritis of knee 08/16/2019  . Mastodynia, female 06/06/2015  . External hemorrhoid 11/30/2012  . Fibroids 11/28/2012  . Vaginal atrophy 11/28/2012  . H/O vitamin D deficiency 11/28/2012  . Osteopenia 11/28/2012  . Menopause 11/28/2012   Past Medical History:  Diagnosis Date  . Fibroid uterus   . Hypertension   . Occipital neuralgia   . Osteopenia 10/2018   T score -1.8 FRAX 5.8% / 1.4%    Family History  Problem Relation Age of Onset  . Heart disease Mother   . Hypertension Mother   . Diabetes Father   . Heart disease Father   . Diabetes Sister   . Heart disease Sister   . Hypertension Sister   . Breast cancer Sister 31  . Cancer Sister        Lung  . Diabetes Brother   . Thyroid disease Daughter   . Migraines Daughter   . Cancer Maternal Grandmother        breast     Past Surgical History:  Procedure Laterality Date  . CATARACT EXTRACTION     Bilateral  . FOOT SURGERY Bilateral   .  TUBAL LIGATION     Social History   Occupational History  . Not on file  Tobacco Use  . Smoking status: Former Research scientist (life sciences)  . Smokeless tobacco: Never Used  Vaping Use  . Vaping Use: Never used  Substance and Sexual Activity  . Alcohol use: Yes    Alcohol/week: 7.0 standard drinks    Types: 7 Glasses of wine per week  . Drug use: No  . Sexual activity: Not Currently    Partners: Male    Comment: 1st intercourse 79 yo-More than 5 partners

## 2019-09-20 ENCOUNTER — Telehealth: Payer: Self-pay | Admitting: Family

## 2019-09-20 MED ORDER — HYALURONAN 88 MG/4ML IX SOSY
88.0000 mg | PREFILLED_SYRINGE | INTRA_ARTICULAR | Status: AC | PRN
  Administered 2019-09-19: 88 mg via INTRA_ARTICULAR

## 2019-09-20 MED ORDER — LIDOCAINE HCL 1 % IJ SOLN
1.0000 mL | INTRAMUSCULAR | Status: AC | PRN
  Administered 2019-09-19: 1 mL

## 2019-09-20 NOTE — Telephone Encounter (Signed)
Patient called requesting a phone call from [place] only. Patient states to have questions about the gel injections administered to her. Please call patient at [phone].

## 2019-09-20 NOTE — Telephone Encounter (Signed)
Please see below.

## 2019-11-14 ENCOUNTER — Ambulatory Visit: Payer: Medicare PPO | Attending: Internal Medicine

## 2019-11-14 DIAGNOSIS — Z23 Encounter for immunization: Secondary | ICD-10-CM

## 2019-11-14 NOTE — Progress Notes (Signed)
   Covid-19 Vaccination Clinic  Name:  Miranda Gonzalez    MRN: [id number] DOB: March 28, 1940  11/14/2019  [name] was observed post Covid-19 immunization for 30 minutes based on pre-vaccination screening without incident. She was provided with Vaccine Information Sheet and instruction to access the V-Safe system.   [name] was instructed to call 911 with any severe reactions post vaccine: Marland Kitchen Difficulty breathing  . Swelling of face and throat  . A fast heartbeat  . A bad rash all over body  . Dizziness and weakness

## 2020-02-15 ENCOUNTER — Encounter: Payer: Self-pay | Admitting: Obstetrics and Gynecology

## 2020-02-15 ENCOUNTER — Other Ambulatory Visit: Payer: Self-pay

## 2020-02-15 ENCOUNTER — Ambulatory Visit: Payer: Medicare PPO | Admitting: Obstetrics and Gynecology

## 2020-02-15 VITALS — BP 124/76 | HR 68 | Wt 174.0 lb

## 2020-02-15 DIAGNOSIS — N644 Mastodynia: Secondary | ICD-10-CM

## 2020-02-15 NOTE — Progress Notes (Signed)
   Miranda Gonzalez July 09, 1940 [id number]  SUBJECTIVE:  80 y.o. [id number] female presents for concern of a "pinching" sensation in the left breast.  A few days ago she felt it once, the other night she felt that twice, first when she was lying down and shifted position from the right to left side, and then felt that again when she shifted back to the other side.  No ongoing discomfort.  No nipple changes or drainage.  She has not felt any lumps or bumps. Last screening mammogram was 10/2018.   Current Outpatient Medications  Medication Sig Dispense Refill  . ACCU-CHEK AVIVA PLUS test strip See admin instructions.  3  . amLODipine (NORVASC) 5 MG tablet Take 5 mg by mouth daily.    . Ascorbic Acid (VITAMIN C PO) Take by mouth.    Marland Kitchen atenolol (TENORMIN) 50 MG tablet Take 50 mg by mouth 2 (two) times daily.    Marland Kitchen atorvastatin (LIPITOR) 40 MG tablet atorvastatin 40 mg tablet  TAKE 1 TABLET BY MOUTH EVERY DAY    . Calcium Carbonate-Vitamin D 600-400 MG-UNIT tablet Take 1 tablet by mouth daily.    . hydrochlorothiazide (HYDRODIURIL) 25 MG tablet Take 25 mg by mouth daily.    Marland Kitchen MAGNESIUM PO Take by mouth.    . Potassium 75 MG TABS Take by mouth.     No current facility-administered medications for this visit.   Allergies: Grapeseed extract [nutritional supplements], Phenergan [promethazine hcl], Eggs or egg-derived products, and Promethazine  No LMP recorded. Patient is postmenopausal.  Past medical history,surgical history, problem list, medications, allergies, family history and social history were all reviewed and documented as reviewed in the EPIC chart.  ROS: Positives and negatives as reviewed in HPI   OBJECTIVE:  BP 124/76   Pulse 68   Wt 174 lb (78.9 kg)   BMI 31.83 kg/m  The patient appears well, alert, oriented, in no distress.  BREAST EXAM: left breast normal without mass, skin or nipple changes or axillary nodes, slight tenderness to deeper palpation 6 cm lateral of the nipple  on the left breast at the 3:00 area  Chaperone: Terence Lux present during the examination   ASSESSMENT:  80 y.o. [id number] here for left breast exam with transient unilateral mastalgia  PLAN:  Patient is reassured there is no palpable abnormality.  She does have a slight tenderness in the left breast area so we will go ahead and have office staff help her coordinate a diagnostic left mammogram the area in question.   Joseph Pierini MD 02/15/20

## 2020-02-16 ENCOUNTER — Telehealth: Payer: Self-pay | Admitting: *Deleted

## 2020-02-16 DIAGNOSIS — N644 Mastodynia: Secondary | ICD-10-CM

## 2020-02-16 NOTE — Telephone Encounter (Signed)
Patient scheduled at the breast center on 03/29/20 @ 2:30pm. Patient informed with time and date aware to call daily/ weekly to check cancellation.

## 2020-02-16 NOTE — Telephone Encounter (Signed)
-----   Message from Joseph Pierini, MD sent at 02/15/2020  4:23 PM EST ----- Regarding: Left breast diagnostic mammogram Please the patient with scheduling a left diagnostic mammogram, she will also be doing a screening mammogram in March so she could do it at the same time

## 2020-03-15 ENCOUNTER — Other Ambulatory Visit: Payer: Self-pay

## 2020-03-15 ENCOUNTER — Encounter: Payer: Self-pay | Admitting: Emergency Medicine

## 2020-03-15 ENCOUNTER — Ambulatory Visit
Admission: EM | Admit: 2020-03-15 | Discharge: 2020-03-15 | Disposition: A | Discharge status: Home / Self Care | Payer: Medicare PPO | Attending: Family Medicine | Admitting: Family Medicine

## 2020-03-15 DIAGNOSIS — M109 Gout, unspecified: Secondary | ICD-10-CM | POA: Diagnosis not present

## 2020-03-15 MED ORDER — PREDNISONE 20 MG PO TABS: 20.0000 mg | ORAL_TABLET | Freq: Every day | ORAL | 0 refills | Status: AC

## 2020-03-15 MED ORDER — DEXAMETHASONE SODIUM PHOSPHATE 10 MG/ML IJ SOLN
10.0000 mg | Freq: Once | INTRAMUSCULAR | Status: AC
  Administered 2020-03-15: 10 mg via INTRAMUSCULAR

## 2020-03-15 NOTE — ED Triage Notes (Signed)
Pt here for right thumb pain from gout x 5 days

## 2020-03-15 NOTE — ED Provider Notes (Signed)
[facility]    CSN: [id number] Arrival date & time: 03/15/20  0941      History   Chief Complaint Chief Complaint  Patient presents with  . Hand Pain    HPI Miranda Gonzalez is a 80 y.o. female.   HPI  Patient with a history of osteoarthritis of bilateral knees, hypertension and osteopenia who presents today with right thumb swelling, pain and redness ongoing x5 days.  Patient reports a distant history of recurrent gout and current symptoms have precipitated as previous episodes of gout.  She denies intake of any new foods or any foods that likely would have resulted in a gouty arthritis flare.  She has tried conservative measures to resolve current symptoms without resolution.  She is also had some stiffness involving the base of her thumb and increasing movement pain today.  She denies any history of diabetes or any known renal disease.  Past Medical History:  Diagnosis Date  . Fibroid uterus   . Hypertension   . Occipital neuralgia   . Osteopenia 10/2018   T score -1.8 FRAX 5.8% / 1.4%    Patient Active Problem List   Diagnosis Date Noted  . Bilateral primary osteoarthritis of knee 08/16/2019  . Mastodynia, female 06/06/2015  . External hemorrhoid 11/30/2012  . Fibroids 11/28/2012  . Vaginal atrophy 11/28/2012  . H/O vitamin D deficiency 11/28/2012  . Osteopenia 11/28/2012  . Menopause 11/28/2012    Past Surgical History:  Procedure Laterality Date  . CATARACT EXTRACTION     Bilateral  . FOOT SURGERY Bilateral   . TUBAL LIGATION      OB History    Gravida  4   Para  3   Term      Preterm      AB  1   Living  3     SAB  1   IAB      Ectopic      Multiple      Live Births               Home Medications    Prior to Admission medications   Medication Sig Start Date End Date Taking? Authorizing Provider  ACCU-CHEK AVIVA PLUS test strip See admin instructions. 01/09/14   [provider]  amLODipine (NORVASC) 5 MG  tablet Take 5 mg by mouth daily.    [provider]  Ascorbic Acid (VITAMIN C PO) Take by mouth.    [provider]  atenolol (TENORMIN) 50 MG tablet Take 50 mg by mouth 2 (two) times daily.    [provider]  atorvastatin (LIPITOR) 40 MG tablet atorvastatin 40 mg tablet  TAKE 1 TABLET BY MOUTH EVERY DAY    [provider]  Calcium Carbonate-Vitamin D 600-400 MG-UNIT tablet Take 1 tablet by mouth daily.    [provider]  hydrochlorothiazide (HYDRODIURIL) 25 MG tablet Take 25 mg by mouth daily.    [provider]  MAGNESIUM PO Take by mouth.    [provider]  Potassium 75 MG TABS Take by mouth.    [provider]    Family History Family History  Problem Relation Age of Onset  . Heart disease Mother   . Hypertension Mother   . Diabetes Father   . Heart disease Father   . Diabetes Sister   . Heart disease Sister   . Hypertension Sister   . Breast cancer Sister 5  . Cancer Sister  Lung  . Diabetes Brother   . Thyroid disease Daughter   . Migraines Daughter   . Cancer Maternal Grandmother        breast     Social History Social History   Tobacco Use  . Smoking status: Former Research scientist (life sciences)  . Smokeless tobacco: Never Used  Vaping Use  . Vaping Use: Never used  Substance Use Topics  . Alcohol use: Yes    Alcohol/week: 7.0 standard drinks    Types: 7 Glasses of wine per week  . Drug use: No     Allergies   Grapeseed extract [nutritional supplements], Phenergan [promethazine hcl], Eggs or egg-derived products, and Promethazine   Review of Systems Review of Systems Pertinent negatives listed in HPI   Physical Exam Triage Vital Signs ED Triage Vitals  Enc Vitals Group     BP 03/15/20 1111 (!) 142/62     Pulse Rate 03/15/20 1111 96     Resp 03/15/20 1111 18     Temp 03/15/20 1111 98.1 F (36.7 C)     Temp Source 03/15/20 1111 Oral     SpO2 03/15/20 1111 98 %     Weight --      Height  --      Head Circumference --      Peak Flow --      Pain Score 03/15/20 1112 8     Pain Loc --      Pain Edu? --      Excl. in [place]? --    No data found.  Updated Vital Signs BP (!) 142/62 (BP Location: Left Arm)   Pulse 96   Temp 98.1 F (36.7 C) (Oral)   Resp 18   SpO2 98%   Visual Acuity Right Eye Distance:   Left Eye Distance:   Bilateral Distance:    Right Eye Near:   Left Eye Near:    Bilateral Near:     Physical Exam General appearance: Alert, well developed, well nourished, cooperative  Head: Normocephalic, without obvious abnormality, atraumatic Respiratory: Respirations even and unlabored, normal respiratory rate Heart: Rate and rhythm normal. No gallop or murmurs  Extremities: Right wrist and right hand without gross deformity, right thumb erythematous at base,  pain with movement, redness at the palmar surface of thumb Skin: Skin color, texture, turgor normal. No rashes seen  Psych: Appropriate mood and affect. Neurologic: GCS 15, normal coordination normal gait  UC Treatments / Results  Labs (all labs ordered are listed, but only abnormal results are displayed) Labs Reviewed - No data to display  EKG   Radiology No results found.  Procedures Procedures (including critical care time)  Medications Ordered in UC Medications - No data to display  Initial Impression / Assessment and Plan / UC Course  I have reviewed the triage vital signs and the nursing notes.  Pertinent labs & imaging results that were available during my care of the patient were reviewed by me and considered in my medical decision making (see chart for details).    Acute gout involving the right hand specifically the right thumb.  Patient received Decadron 10 mg IM today in clinic.  She will start prednisone 20 mg once daily for 5 days on tomorrow.  Advised to avoid any ibuprofen or naproxen while taking prednisone.  For acute pain she can continue Tylenol extra strength 500 mg  every 6 hours as needed for pain.  PCP follow-up if symptoms worsen or do not improve Final Clinical Impressions(s) /  UC Diagnoses   Final diagnoses:  Acute gout of right hand, unspecified cause, right thumb     Discharge Instructions     Start oral prednisone tomorrow.  You received 10 mg of Decadron which is a steroid oral injection for acute gout flare.  For acute pain you may take Tylenol extra strength which is 500 mg every 6 hours as needed for acute pain.  Hydrate well with water this also will help resolve current gout flare.    ED Prescriptions    Medication Sig Dispense Auth. Provider   predniSONE (DELTASONE) 20 MG tablet Take 1 tablet (20 mg total) by mouth daily with breakfast for 5 days. 5 tablet Scot Jun, FNP     PDMP not reviewed this encounter.   Scot Jun, FNP 03/15/20 1151

## 2020-03-15 NOTE — Discharge Instructions (Addendum)
Start oral prednisone tomorrow.  You received 10 mg of Decadron which is a steroid oral injection for acute gout flare.  For acute pain you may take Tylenol extra strength which is 500 mg every 6 hours as needed for acute pain.  Hydrate well with water this also will help resolve current gout flare.

## 2020-03-16 ENCOUNTER — Telehealth: Payer: Self-pay | Admitting: Emergency Medicine

## 2020-03-16 NOTE — Telephone Encounter (Signed)
Patient called asking if steroid shot she received yesterday would impact diabetes.  Reports cbg 150's, and states this is higher than usual.  Reassured patient that steroid injection would make cbg run higher than usual.  Patient is on prednisone, also assured patient that this would make cbg run higher than usual.  Instructed to continue to monitor cbg's and call with any further concerns.  Dr hagler notified of patient and concerns and instructions.

## 2020-03-27 ENCOUNTER — Telehealth: Payer: Self-pay

## 2020-03-27 NOTE — Telephone Encounter (Signed)
Patient called she would like to get pre approval process started for gel injection patient last received monovisc gel injection 09/19/2019 call back:[phone]

## 2020-03-27 NOTE — Telephone Encounter (Signed)
Noted  

## 2020-03-27 NOTE — Telephone Encounter (Signed)
Pt is 6 months since last monovisc injection 09/19/19. Would like to start prior auth process for repeat injections bilat knees.

## 2020-03-28 ENCOUNTER — Encounter: Payer: Self-pay | Admitting: Orthopedic Surgery

## 2020-03-28 ENCOUNTER — Ambulatory Visit (INDEPENDENT_AMBULATORY_CARE_PROVIDER_SITE_OTHER): Payer: Medicare PPO | Admitting: Physician Assistant

## 2020-03-28 DIAGNOSIS — M170 Bilateral primary osteoarthritis of knee: Secondary | ICD-10-CM | POA: Diagnosis not present

## 2020-03-28 NOTE — Progress Notes (Signed)
Office Visit Note   Patient: Miranda Gonzalez           Date of Birth: 1940/08/22           MRN: [id number] Visit Date: 03/28/2020              Requested by: Dixie Dials, MD [address] [place],   [id number] PCP: Dixie Dials, MD  Chief Complaint  Patient presents with  . Left Knee - Pain  . Right Knee - Pain      HPI: Patient presents today with bilateral knee pain.  Also would like to talk about her bilateral forefoot.  She is status post hammertoe corrections and bunion corrections in the remote past especially on her left foot she feels her foot turns in causing more pain in her knee and along the lateral side of her leg she is not interested in any knee replacements at the time.  She has had gel injections in the past and they seemed quite helpful wondering if she could get approved for them again  Assessment & Plan: Visit Diagnoses:  1. Bilateral primary osteoarthritis of knee     Plan: We will go forward in order physical therapy for lower extremity strengthening and gait training.  We will get preauthorization for gel injections.  I have also given her different toe gel sleeves to try which in the office she thought might be quite helpful for her crossover toes and the calluses she develops.  Follow-up in 1 month  Follow-Up Instructions: No follow-ups on file.   Ortho Exam  Patient is alert, oriented, no adenopathy, well-dressed, normal affect, normal respiratory effort. Right knee: She has mild valgus malalignment.  No effusion no soft tissue swelling does have some tenderness over the lateral joint line left knee.  Significant valgus deformity.  Tender over the lateral joint line.  No effusion bilaterally she has positive grinding with range of motion. Bilateral feet.  She has history of well-healed surgical scars from previous hammertoe and bunion surgery.  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  No evidence of infection no open  sores.  Pulses are palpable  Imaging: No results found. No images are attached to the encounter.  Labs: No results found for: HGBA1C, ESRSEDRATE, CRP, LABURIC, REPTSTATUS, GRAMSTAIN, CULT, LABORGA   No results found for: ALBUMIN, PREALBUMIN, CBC  No results found for: MG No results found for: VD25OH  No results found for: PREALBUMIN No flowsheet data found.   There is no height or weight on file to calculate BMI.  Orders:  Orders Placed This Encounter  Procedures  . Ambulatory referral to Physical Therapy   No orders of the defined types were placed in this encounter.    Procedures: No procedures performed  Clinical Data: No additional findings.  ROS:  All other systems negative, except as noted in the HPI. Review of Systems  Objective: Vital Signs: There were no vitals taken for this visit.  Specialty Comments:  No specialty comments available.  PMFS History: Patient Active Problem List   Diagnosis Date Noted  . Bilateral primary osteoarthritis of knee 08/16/2019  . Mastodynia, female 06/06/2015  . External hemorrhoid 11/30/2012  . Fibroids 11/28/2012  . Vaginal atrophy 11/28/2012  . H/O vitamin D deficiency 11/28/2012  . Osteopenia 11/28/2012  . Menopause 11/28/2012   Past Medical History:  Diagnosis Date  . Fibroid uterus   . Hypertension   . Occipital neuralgia   .  Osteopenia 10/2018   T score -1.8 FRAX 5.8% / 1.4%    Family History  Problem Relation Age of Onset  . Heart disease Mother   . Hypertension Mother   . Diabetes Father   . Heart disease Father   . Diabetes Sister   . Heart disease Sister   . Hypertension Sister   . Breast cancer Sister 20  . Cancer Sister        Lung  . Diabetes Brother   . Thyroid disease Daughter   . Migraines Daughter   . Cancer Maternal Grandmother        breast     Past Surgical History:  Procedure Laterality Date  . CATARACT EXTRACTION     Bilateral  . FOOT SURGERY Bilateral   . TUBAL  LIGATION     Social History   Occupational History  . Not on file  Tobacco Use  . Smoking status: Former Research scientist (life sciences)  . Smokeless tobacco: Never Used  Vaping Use  . Vaping Use: Never used  Substance and Sexual Activity  . Alcohol use: Yes    Alcohol/week: 7.0 standard drinks    Types: 7 Glasses of wine per week  . Drug use: No  . Sexual activity: Not Currently    Partners: Male    Comment: 1st intercourse 80 yo-More than 5 partners

## 2020-03-29 ENCOUNTER — Other Ambulatory Visit: Payer: Self-pay

## 2020-03-29 ENCOUNTER — Ambulatory Visit
Admission: RE | Admit: 2020-03-29 | Discharge: 2020-03-29 | Disposition: A | Discharge status: Home / Self Care | Payer: Medicare PPO | Source: Ambulatory Visit | Attending: Obstetrics and Gynecology | Admitting: Obstetrics and Gynecology

## 2020-03-29 ENCOUNTER — Ambulatory Visit: Payer: Medicare PPO

## 2020-03-29 DIAGNOSIS — N644 Mastodynia: Secondary | ICD-10-CM

## 2020-04-15 ENCOUNTER — Other Ambulatory Visit: Payer: Self-pay

## 2020-04-15 ENCOUNTER — Ambulatory Visit: Payer: Medicare PPO | Admitting: Physical Therapy

## 2020-04-15 ENCOUNTER — Encounter: Payer: Self-pay | Admitting: Physical Therapy

## 2020-04-15 DIAGNOSIS — M25562 Pain in left knee: Secondary | ICD-10-CM | POA: Diagnosis not present

## 2020-04-15 DIAGNOSIS — M25561 Pain in right knee: Secondary | ICD-10-CM | POA: Diagnosis not present

## 2020-04-15 DIAGNOSIS — R262 Difficulty in walking, not elsewhere classified: Secondary | ICD-10-CM

## 2020-04-15 DIAGNOSIS — R531 Weakness: Secondary | ICD-10-CM | POA: Diagnosis not present

## 2020-04-15 DIAGNOSIS — G8929 Other chronic pain: Secondary | ICD-10-CM

## 2020-04-15 NOTE — Therapy (Addendum)
[facility] Physical Therapy [address] [place], [place], [address] Phone: [phone]   Fax:  [phone]  Physical Therapy Evaluation  Patient Details  Name: Miranda Gonzalez MRN: [id number] Date of Birth: September 07, 1940 Referring Provider (PT): Bevely Palmer Persons,  PA-C   Encounter Date: 04/15/2020   PT End of Session - 04/15/20 1617    Visit Number 1    Number of Visits 12    Date for PT Re-Evaluation 05/31/20    Authorization Type humana    PT Start Time 1515    PT Stop Time 1600    PT Time Calculation (min) 45 min    Activity Tolerance Patient tolerated treatment well    Behavior During Therapy [facility] for tasks assessed/performed           Past Medical History:  Diagnosis Date  . Fibroid uterus   . Hypertension   . Occipital neuralgia   . Osteopenia 10/2018   T score -1.8 FRAX 5.8% / 1.4%    Past Surgical History:  Procedure Laterality Date  . CATARACT EXTRACTION     Bilateral  . FOOT SURGERY Bilateral   . TUBAL LIGATION      There were no vitals filed for this visit.    Subjective Assessment - 04/15/20 1522    Subjective Pt arriving reporting numbness in right foot after prolonged standing, Pt reporting popping in left knee when walking. Rigth knee aches all the time. Left knee catches after prolongd standing. Pt reporting decreased strength in left LE, R baker cyst behind R knee. Pt reporting difficulty driving and is utilizing her ankle movements. Pt reproting leg twitches at night and calf crampting. Pt stating she is getting enough potassium and water intake.  Pt reporting stiffness and difficulty getting up from toilet. Pt thinks she needs an elevated toilet.    Pertinent History osteopenia, HTN, bilateral knee OA, bakers cyst R knee    Patient Stated Goals Walk better, stop hurting    Currently in Pain? Yes    Pain Score 5     Pain Location Knee    Pain Orientation Right;Left    Pain Descriptors / Indicators Aching    Pain Type  Chronic pain    Aggravating Factors  bending touch back of right knee, prolonged standing    Pain Relieving Factors resting, changing positions    Effect of Pain on Daily Activities difficulty with household chores and standing prolonged for ADL's              [facility] PT Assessment - 04/15/20 0001      Assessment   Medical Diagnosis M17.0 primary OA bialteral knee    Referring Provider (PT) Bevely Palmer Persons,  PA-C    Hand Dominance Right    Prior Therapy yes for cervical issues      Precautions   Precautions None      Restrictions   Weight Bearing Restrictions No      Balance Screen   Has the patient fallen in the past 6 months No    Is the patient reluctant to leave their home because of a fear of falling?  No      Home Environment   Living Environment Private residence    Living Arrangements Children    Type of [place] Two level    Alternate Level Stairs-Number of Steps 14   with landing after 7 steps     Cognition   Overall Cognitive Status Within  Functional Limits for tasks assessed      Observation/Other Assessments   Focus on Therapeutic Outcomes (FOTO)  38 (predicted 55)      Observation/Other Assessments-Edema    Edema --   R knee 38 centimeters, L knee 37 centimeters     ROM / Strength   AROM / PROM / Strength AROM;Strength      AROM   AROM Assessment Site Knee    Right/Left Knee Right;Left    Right Knee Extension 8    Right Knee Flexion 115    Left Knee Extension 5    Left Knee Flexion 120      Strength   Overall Strength Comments bilateral df/pf grossly 4+/5    Strength Assessment Site Hip;Knee    Right/Left Hip Right;Left    Right Hip Flexion 3/5    Right Hip ABduction 4/5    Right Hip ADduction 4/5    Left Hip Flexion 4/5    Left Hip ABduction 4/5    Left Hip ADduction 4/5    Right/Left Knee Right;Left    Right Knee Flexion 4-/5    Right Knee Extension 4+/5    Left Knee Flexion 4/5    Left Knee Extension 4+/5       Palpation   Palpation comment TTP, R posterior knee due to bakers cyst,                      Objective measurements completed on examination: See above findings.       [facility] Adult PT Treatment/Exercise - 04/15/20 0001      Transfers   Five time sit to stand comments  32 seocnds using inermittent UE support for push off      Ambulation/Gait   Assistive device None    Gait Pattern Step-through pattern;Decreased dorsiflexion - left;Decreased dorsiflexion - right;Wide base of support    Gait Comments Pt amb on level surface with wide BOS, genu valgus in left knee and toe out with decreased step length and heel to toe gait pattern bilaterally      Posture/Postural Control   Posture/Postural Control Postural limitations    Postural Limitations Rounded Shoulders;Forward head                  PT Education - 04/15/20 1615    Education Details PT POC, HEP, edu about hydration and cause of crampting    Person(s) Educated Patient    Methods Explanation;Demonstration;Handout;Verbal cues;Tactile cues    Comprehension Verbalized understanding;Returned demonstration            PT Short Term Goals - 04/15/20 1618      PT SHORT TERM GOAL #1   Title Pt will be independent in her initial HEP    Time 3    Period Weeks    Status New             PT Long Term Goals - 04/15/20 1619      PT LONG TERM GOAL #1   Title Pt will be able to perform 5 time sit to stand in </= 20 seconds with no UE support.    Baseline 32 seconds using UE support    Time 6    Period Weeks    Status New    Target Date 05/31/20      PT LONG TERM GOAL #2   Title Pt will improve R hip flexion to >/= 4+/5 strength to improve functional mobiltiy.    Time 6  Period Weeks    Status New    Target Date 05/31/20      PT LONG TERM GOAL #3   Title Pt will be able to report being able to stand for >/= 30 minutes with pain </= 3/10 in bilateral knees.    Time 6    Period Weeks    Status New       PT LONG TERM GOAL #4   Title Pt will improve her FOTO from 97 to >/= 55.    Time 6    Period Weeks    Status New    Target Date 05/31/20      PT LONG TERM GOAL #5   Title -    Baseline -                  Plan - 04/15/20 1623    Clinical Impression Statement Pt arriving to therapy today for evaluation of her bilaeral knee osteoarthritis. Pt with right Baker's cyst which limits pt's function. Pt presenting with decresaed strength grossly 4/5 in bilateral hamstrings and right knee extension. Pt with weakness of 3/5 in right hip fleixon and 4/5 in left hip flexion. Pt presenting with left knee valgus. Pt with increased edema noted in right knee of 1 centimeter compared to left. Pt states she has tried injections in her knees in the past and it has not helped. Pt amb with wide BOS and left toe out. Skilled PT needed to address pt's impairments with the below inteventions.    Personal Factors and Comorbidities Comorbidity 3+    Comorbidities osteopenia, OA, HTN, Baker's cyst right knee    Examination-Activity Limitations Squat;Stairs;Stand;Lift;Transfers    Examination-Participation Restrictions Driving;Community Activity;Other    Stability/Clinical Decision Making Evolving/Moderate complexity    Clinical Decision Making Moderate    Rehab Potential Fair    PT Frequency 2x / week    PT Duration 6 weeks    PT Treatment/Interventions ADLs/Self Care Home Management;Cryotherapy;Electrical Stimulation;Iontophoresis 4mg /ml Dexamethasone;Moist Heat;Ultrasound;Balance training;Therapeutic exercise;Therapeutic activities;Functional mobility training;Stair training;Gait training;Neuromuscular re-education;Patient/family education;Passive range of motion;Manual techniques;Dry needling;Taping    PT Next Visit Plan Nustep vs bike, LE strengthening, calf and hamstring stretching    PT Home Exercise Plan Access Code: [email or web address]  URL: [email or web address]  Date: 04/15/2020  Prepared  by: Kearney Hard    Exercises  Hooklying Hamstring Stretch with Strap - 2 x daily - 7 x weekly - 5 reps - 30 seconds hold  Supine Bridge - 2 x daily - 7 x weekly - 2 sets - 10 reps - 5 seconds hold  Active Straight Leg Raise with Quad Set - 2 x daily - 7 x weekly - 2 sets - 10 reps  Sit to Stand with Counter Support - 3 x daily - 7 x weekly - 2 sets - 10 reps  Seated March - 2 x daily - 7 x weekly - 2 sets - 10 reps - 3 seconds hold    Consulted and Agree with Plan of Care Patient           Patient will benefit from skilled therapeutic intervention in order to improve the following deficits and impairments:  Pain,Decreased activity tolerance,Decreased range of motion,Decreased strength,Decreased balance,Difficulty walking,Impaired flexibility  Visit Diagnosis: Chronic pain of right knee  Chronic pain of left knee  Difficulty in walking, not elsewhere classified  Weakness     Problem List Patient Active Problem List   Diagnosis Date Noted  . Bilateral primary osteoarthritis of knee 08/16/2019  .  Mastodynia, female 06/06/2015  . External hemorrhoid 11/30/2012  . Fibroids 11/28/2012  . Vaginal atrophy 11/28/2012  . H/O vitamin D deficiency 11/28/2012  . Osteopenia 11/28/2012  . Menopause 11/28/2012   Referring diagnosis? M17.0 Treatment diagnosis? (if different than referring diagnosis) M25.562, M25.561, R26.2, R53.1 What was this (referring dx) caused by? []  Surgery []  Fall [x]  Ongoing issue []  Arthritis []  Other: ____________  Laterality: []  Rt []  Lt [x]  Both  Check all possible CPT codes:      [x]  97110 (Therapeutic Exercise)  []  92507 (SLP Treatment)  [x]  97112 (Neuro Re-ed)   []  92526 (Swallowing Treatment)   [x]  97116 (Gait Training)   []  [id number] (Cognitive Training, 1st 15 minutes) [x]  97140 (Manual Therapy)   []  97130 (Cognitive Training, each add'l 15 minutes)  [x]  97530 (Therapeutic Activities)  []  Other, List CPT Code ____________    [x]  48270 (Self  Care)       [x]  All codes above (97110 - 97535)  []  97012 (Mechanical Traction)  [x]  97014 (E-stim Unattended)  []  97032 (E-stim manual)  [x]  97033 (Ionto)  [x]  97035 (Ultrasound)  []  97760 (Orthotic Fit) [x]  97750 (Physical Performance Training) []  [id number] (Aquatic Therapy) []  97034 (Contrast Bath) []  97018 (Paraffin) []  97597 (Wound Care 1st 20 sq cm) []  97598 (Wound Care each add'l 20 sq cm) []  97016 (Vasopneumatic Device) []  [id number] (Orthotic Training) []  [id number] (Prosthetic Training)    Oretha Caprice, PT, MPT 04/15/2020, 5:07 PM  [facility] Physical Therapy [address] [place], [place], [address] Phone: [phone]   Fax:  [phone]  Name: Miranda Gonzalez MRN: [id number] Date of Birth: 1940-12-16

## 2020-04-15 NOTE — Patient Instructions (Signed)
Access Code: [email or web address] URL: [email or web address] Date: 04/15/2020 Prepared by: Kearney Hard  Exercises Hooklying Hamstring Stretch with Strap - 2 x daily - 7 x weekly - 5 reps - 30 seconds hold Supine Bridge - 2 x daily - 7 x weekly - 2 sets - 10 reps - 5 seconds hold Active Straight Leg Raise with Quad Set - 2 x daily - 7 x weekly - 2 sets - 10 reps Sit to Stand with Counter Support - 3 x daily - 7 x weekly - 2 sets - 10 reps Seated March - 2 x daily - 7 x weekly - 2 sets - 10 reps - 3 seconds hold

## 2020-04-29 ENCOUNTER — Encounter: Payer: Self-pay | Admitting: Physical Therapy

## 2020-04-29 ENCOUNTER — Other Ambulatory Visit: Payer: Self-pay

## 2020-04-29 ENCOUNTER — Telehealth: Payer: Self-pay

## 2020-04-29 ENCOUNTER — Ambulatory Visit: Payer: Medicare PPO | Admitting: Physical Therapy

## 2020-04-29 DIAGNOSIS — R262 Difficulty in walking, not elsewhere classified: Secondary | ICD-10-CM

## 2020-04-29 DIAGNOSIS — M25562 Pain in left knee: Secondary | ICD-10-CM | POA: Diagnosis not present

## 2020-04-29 DIAGNOSIS — R531 Weakness: Secondary | ICD-10-CM

## 2020-04-29 DIAGNOSIS — G8929 Other chronic pain: Secondary | ICD-10-CM

## 2020-04-29 DIAGNOSIS — M25561 Pain in right knee: Secondary | ICD-10-CM

## 2020-04-29 NOTE — Therapy (Signed)
[facility] Physical Therapy [address] [place], [place], [address] Phone: [phone]   Fax:  [phone]  Physical Therapy Treatment  Patient Details  Name: Miranda Gonzalez MRN: [id number] Date of Birth: Jul 31, 1940 Referring Provider (PT): Bevely Palmer Persons,  PA-C   Encounter Date: 04/29/2020   PT End of Session - 04/29/20 1447    Visit Number 2    Number of Visits 12    Date for PT Re-Evaluation 05/31/20    Authorization Type humana    PT Start Time 1434    PT Stop Time 1515    PT Time Calculation (min) 41 min    Activity Tolerance Patient tolerated treatment well    Behavior During Therapy [facility] for tasks assessed/performed           Past Medical History:  Diagnosis Date  . Fibroid uterus   . Hypertension   . Occipital neuralgia   . Osteopenia 10/2018   T score -1.8 FRAX 5.8% / 1.4%    Past Surgical History:  Procedure Laterality Date  . CATARACT EXTRACTION     Bilateral  . FOOT SURGERY Bilateral   . TUBAL LIGATION      There were no vitals filed for this visit.   Subjective Assessment - 04/29/20 1443    Subjective Pt arriving today reporting 7/10 pain in bilateral knees on lateral sides. Pt stating she has been doing her HEP, but she is not able to lift her bottom when performing a bridge on her bed.    Pertinent History osteopenia, HTN, bilateral knee OA, bakers cyst R knee    Patient Stated Goals Walk better, stop hurting    Currently in Pain? Yes    Pain Score 7     Pain Location Knee    Pain Orientation Right;Left    Pain Descriptors / Indicators Aching    Pain Type Chronic pain    Pain Onset More than a month ago    Pain Frequency Constant                             [facility] Adult PT Treatment/Exercise - 04/29/20 0001      Exercises   Exercises Knee/Hip      Knee/Hip Exercises: Stretches   Active Hamstring Stretch Both;2 reps;20 seconds      Knee/Hip Exercises: Aerobic   Recumbent Bike L2 x 10  minutes      Knee/Hip Exercises: Standing   Heel Raises 15 reps    Hip ADduction Strengthening;Both;15 reps    Forward Step Up 10 reps;Hand Hold: 1;Step Height: 4"      Knee/Hip Exercises: Seated   Long Arc Quad Strengthening;Both;15 reps    Hamstring Curl Strengthening;Both;10 reps;Limitations    Hamstring Limitations Level 2 theraband    Sit to [employer] 10 reps;with UE support      Knee/Hip Exercises: Supine   Bridges Strengthening;2 sets;10 reps    Straight Leg Raises Strengthening;Both;2 sets;10 reps    Other Supine Knee/Hip Exercises clam shells red theraband 2x10      Knee/Hip Exercises: Sidelying   Hip ABduction Strengthening;Both;2 sets;10 reps                    PT Short Term Goals - 04/29/20 1449      PT SHORT TERM GOAL #1   Title Pt will be independent in her initial HEP    Status On-going  PT Long Term Goals - 04/29/20 1450      PT LONG TERM GOAL #1   Title Pt will be able to perform 5 time sit to stand in </= 20 seconds with no UE support.    Baseline 32 seconds using UE support    Status On-going      PT LONG TERM GOAL #2   Title Pt will improve R hip flexion to >/= 4+/5 strength to improve functional mobiltiy.    Status On-going      PT LONG TERM GOAL #3   Title Pt will be able to report being able to stand for >/= 30 minutes with pain </= 3/10 in bilateral knees.    Baseline -    Status On-going      PT LONG TERM GOAL #4   Title Pt will improve her FOTO from 38 to >/= 55.    Baseline -    Status On-going                 Plan - 04/29/20 1501    Clinical Impression Statement Pt arriving today reporting 7/10 pain in bilateral knees. Pt tolerating exercises well limited by occassional hamstring cramping and pain in right Baker's cyst. Treatment focused on general LE strengtening in supine, sitting and standing. Continue to progress as pt tolerates toward goals set.    Personal Factors and Comorbidities Comorbidity 3+     Comorbidities osteopenia, OA, HTN, Baker's cyst right knee    Examination-Activity Limitations Squat;Stairs;Stand;Lift;Transfers    Examination-Participation Restrictions Driving;Community Activity;Other    Stability/Clinical Decision Making Evolving/Moderate complexity    PT Frequency 2x / week    PT Duration 6 weeks    PT Treatment/Interventions ADLs/Self Care Home Management;Cryotherapy;Electrical Stimulation;Iontophoresis 4mg /ml Dexamethasone;Moist Heat;Ultrasound;Balance training;Therapeutic exercise;Therapeutic activities;Functional mobility training;Stair training;Gait training;Neuromuscular re-education;Patient/family education;Passive range of motion;Manual techniques;Dry needling;Taping    PT Next Visit Plan bike, LE strengthening, calf and hamstring stretching    PT Home Exercise Plan Access Code: [email or web address]  URL: [email or web address]  Date: 04/15/2020  Prepared by: Kearney Hard    Exercises  Hooklying Hamstring Stretch with Strap - 2 x daily - 7 x weekly - 5 reps - 30 seconds hold  Supine Bridge - 2 x daily - 7 x weekly - 2 sets - 10 reps - 5 seconds hold  Active Straight Leg Raise with Quad Set - 2 x daily - 7 x weekly - 2 sets - 10 reps  Sit to Stand with Counter Support - 3 x daily - 7 x weekly - 2 sets - 10 reps  Seated March - 2 x daily - 7 x weekly - 2 sets - 10 reps - 3 seconds hold    Consulted and Agree with Plan of Care Patient           Patient will benefit from skilled therapeutic intervention in order to improve the following deficits and impairments:  Pain,Decreased activity tolerance,Decreased range of motion,Decreased strength,Decreased balance,Difficulty walking,Impaired flexibility  Visit Diagnosis: Chronic pain of right knee  Chronic pain of left knee  Difficulty in walking, not elsewhere classified  Weakness     Problem List Patient Active Problem List   Diagnosis Date Noted  . Bilateral primary osteoarthritis of knee 08/16/2019  .  Mastodynia, female 06/06/2015  . External hemorrhoid 11/30/2012  . Fibroids 11/28/2012  . Vaginal atrophy 11/28/2012  . H/O vitamin D deficiency 11/28/2012  . Osteopenia 11/28/2012  . Menopause 11/28/2012    Oretha Caprice, PT, MPT 04/29/2020, 3:16  PM  [facility] Physical Therapy [address] [place], [place], [address] Phone: [phone]   Fax:  [phone]  Name: Miranda Gonzalez MRN: [id number] Date of Birth: [birth year]

## 2020-04-29 NOTE — Telephone Encounter (Signed)
VOB has been submitted for Monovisc, bilateral knee. Pending BV. 

## 2020-05-03 ENCOUNTER — Other Ambulatory Visit: Payer: Self-pay

## 2020-05-03 ENCOUNTER — Ambulatory Visit: Payer: Medicare PPO | Admitting: Physical Therapy

## 2020-05-03 ENCOUNTER — Encounter: Payer: Self-pay | Admitting: Physical Therapy

## 2020-05-03 DIAGNOSIS — R262 Difficulty in walking, not elsewhere classified: Secondary | ICD-10-CM

## 2020-05-03 DIAGNOSIS — G8929 Other chronic pain: Secondary | ICD-10-CM

## 2020-05-03 DIAGNOSIS — M25561 Pain in right knee: Secondary | ICD-10-CM | POA: Diagnosis not present

## 2020-05-03 DIAGNOSIS — R531 Weakness: Secondary | ICD-10-CM

## 2020-05-03 DIAGNOSIS — M25562 Pain in left knee: Secondary | ICD-10-CM | POA: Diagnosis not present

## 2020-05-03 NOTE — Therapy (Signed)
[facility] Physical Therapy [address] [place], [place], [address] Phone: [phone]   Fax:  [phone]  Physical Therapy Treatment  Patient Details  Name: Miranda Gonzalez MRN: [id number] Date of Birth: [birth year] Referring Provider (PT): Bevely Palmer Persons,  PA-C   Encounter Date: 05/03/2020   PT End of Session - 05/03/20 1144    Visit Number 3    Number of Visits 12    Date for PT Re-Evaluation 05/31/20    Authorization Type humana    PT Start Time 1100    PT Stop Time 1142    PT Time Calculation (min) 42 min    Activity Tolerance Patient tolerated treatment well    Behavior During Therapy [facility] for tasks assessed/performed           Past Medical History:  Diagnosis Date  . Fibroid uterus   . Hypertension   . Occipital neuralgia   . Osteopenia 10/2018   T score -1.8 FRAX 5.8% / 1.4%    Past Surgical History:  Procedure Laterality Date  . CATARACT EXTRACTION     Bilateral  . FOOT SURGERY Bilateral   . TUBAL LIGATION      There were no vitals filed for this visit.   Subjective Assessment - 05/03/20 1102    Subjective knees are stiff, baker's cyst is uncomfortable today    Pertinent History osteopenia, HTN, bilateral knee OA, bakers cyst R knee    Patient Stated Goals Walk better, stop hurting    Currently in Pain? Yes    Pain Score 7     Pain Location Knee    Pain Orientation Right;Left    Pain Descriptors / Indicators Aching    Pain Type Chronic pain    Pain Onset More than a month ago    Pain Frequency Constant    Aggravating Factors  bending, touching the back of knee, prolonged standing, descending stairs    Pain Relieving Factors resting changing positions                             [facility] Adult PT Treatment/Exercise - 05/03/20 1107      Knee/Hip Exercises: Aerobic   Recumbent Bike L3 x 8 minutes      Knee/Hip Exercises: Machines for Strengthening   Cybex Leg Press 50# 2x10      Knee/Hip Exercises:  Seated   Long Arc Quad Strengthening;Both;Weights;10 reps    Long Arc Quad Weight 2 lbs.    Other Seated Knee/Hip Exercises seated SLR x 10 reps; Rt                    PT Short Term Goals - 05/03/20 1144      PT SHORT TERM GOAL #1   Title Pt will be independent in her initial HEP    Status On-going             PT Long Term Goals - 04/29/20 1450      PT LONG TERM GOAL #1   Title Pt will be able to perform 5 time sit to stand in </= 20 seconds with no UE support.    Baseline 32 seconds using UE support    Status On-going      PT LONG TERM GOAL #2   Title Pt will improve R hip flexion to >/= 4+/5 strength to improve functional mobiltiy.    Status On-going      PT LONG TERM  GOAL #3   Title Pt will be able to report being able to stand for >/= 30 minutes with pain </= 3/10 in bilateral knees.    Baseline -    Status On-going      PT LONG TERM GOAL #4   Title Pt will improve her FOTO from 38 to >/= 55.    Baseline -    Status On-going                 Plan - 05/03/20 1144    Clinical Impression Statement Pt tolerated session well with focus on eccentric quad control.  Will cotninue to benefit from PT to maximize function.  All goals ongoing at this time.    Personal Factors and Comorbidities Comorbidity 3+    Comorbidities osteopenia, OA, HTN, Baker's cyst right knee    Examination-Activity Limitations Squat;Stairs;Stand;Lift;Transfers    Examination-Participation Restrictions Driving;Community Activity;Other    Stability/Clinical Decision Making Evolving/Moderate complexity    PT Frequency 2x / week    PT Duration 6 weeks    PT Treatment/Interventions ADLs/Self Care Home Management;Cryotherapy;Electrical Stimulation;Iontophoresis 4mg /ml Dexamethasone;Moist Heat;Ultrasound;Balance training;Therapeutic exercise;Therapeutic activities;Functional mobility training;Stair training;Gait training;Neuromuscular re-education;Patient/family education;Passive range of  motion;Manual techniques;Dry needling;Taping    PT Next Visit Plan bike, LE strengthening, calf and hamstring stretching    PT Home Exercise Plan Access Code: [email or web address]  URL: [email or web address]  Date: 04/15/2020  Prepared by: Kearney Hard    Exercises  Hooklying Hamstring Stretch with Strap - 2 x daily - 7 x weekly - 5 reps - 30 seconds hold  Supine Bridge - 2 x daily - 7 x weekly - 2 sets - 10 reps - 5 seconds hold  Active Straight Leg Raise with Quad Set - 2 x daily - 7 x weekly - 2 sets - 10 reps  Sit to Stand with Counter Support - 3 x daily - 7 x weekly - 2 sets - 10 reps  Seated March - 2 x daily - 7 x weekly - 2 sets - 10 reps - 3 seconds hold    Consulted and Agree with Plan of Care Patient           Patient will benefit from skilled therapeutic intervention in order to improve the following deficits and impairments:  Pain,Decreased activity tolerance,Decreased range of motion,Decreased strength,Decreased balance,Difficulty walking,Impaired flexibility  Visit Diagnosis: Chronic pain of right knee  Chronic pain of left knee  Difficulty in walking, not elsewhere classified  Weakness     Problem List Patient Active Problem List   Diagnosis Date Noted  . Bilateral primary osteoarthritis of knee 08/16/2019  . Mastodynia, female 06/06/2015  . External hemorrhoid 11/30/2012  . Fibroids 11/28/2012  . Vaginal atrophy 11/28/2012  . H/O vitamin D deficiency 11/28/2012  . Osteopenia 11/28/2012  . Menopause 11/28/2012      Laureen Abrahams, PT, DPT 05/03/20 11:46 AM    [facility] Physical Therapy [address] [place], [place], [address] Phone: [phone]   Fax:  [phone]  Name: Miranda Gonzalez MRN: [id number] Date of Birth: 04-16-1940

## 2020-05-06 ENCOUNTER — Telehealth: Payer: Self-pay

## 2020-05-06 NOTE — Telephone Encounter (Signed)
PA required for Monovisc, bilateral knee. PA submitted online through [facility] portal Pending PA# [id number]

## 2020-05-07 ENCOUNTER — Other Ambulatory Visit: Payer: Self-pay

## 2020-05-07 ENCOUNTER — Ambulatory Visit: Payer: Medicare PPO | Admitting: Physical Therapy

## 2020-05-07 ENCOUNTER — Encounter: Payer: Self-pay | Admitting: Physical Therapy

## 2020-05-07 DIAGNOSIS — M25562 Pain in left knee: Secondary | ICD-10-CM

## 2020-05-07 DIAGNOSIS — R531 Weakness: Secondary | ICD-10-CM

## 2020-05-07 DIAGNOSIS — M25561 Pain in right knee: Secondary | ICD-10-CM

## 2020-05-07 DIAGNOSIS — R262 Difficulty in walking, not elsewhere classified: Secondary | ICD-10-CM | POA: Diagnosis not present

## 2020-05-07 DIAGNOSIS — G8929 Other chronic pain: Secondary | ICD-10-CM

## 2020-05-07 NOTE — Therapy (Addendum)
[facility] Physical Therapy [address] [place], [place], [address] Phone: [phone]   Fax:  [phone]  Physical Therapy Treatment  Patient Details  Name: Miranda Gonzalez MRN: [id number] Date of Birth: 09-Dec-1940 Referring Provider (PT): Bevely Palmer Persons,  PA-C   Encounter Date: 05/07/2020   PT End of Session - 05/07/20 1117    Visit Number 4    Number of Visits 12    Date for PT Re-Evaluation 05/31/20    Authorization Type humana    PT Start Time 1107    PT Stop Time 1145    PT Time Calculation (min) 38 min    Activity Tolerance Patient tolerated treatment well    Behavior During Therapy [facility] for tasks assessed/performed           Past Medical History:  Diagnosis Date  . Fibroid uterus   . Hypertension   . Occipital neuralgia   . Osteopenia 10/2018   T score -1.8 FRAX 5.8% / 1.4%    Past Surgical History:  Procedure Laterality Date  . CATARACT EXTRACTION     Bilateral  . FOOT SURGERY Bilateral   . TUBAL LIGATION      There were no vitals filed for this visit.   Subjective Assessment - 05/07/20 1115    Subjective Pt still reporting pain on right lateral knee due to Baker's cyst.    Pertinent History osteopenia, HTN, bilateral knee OA, bakers cyst R knee    Patient Stated Goals Walk better, stop hurting    Currently in Pain? Yes    Pain Score 6     Pain Location Knee    Pain Orientation Right    Pain Descriptors / Indicators Aching    Pain Type Chronic pain                             [facility] Adult PT Treatment/Exercise - 05/07/20 0001      Knee/Hip Exercises: Stretches   Gastroc Stretch Both;2 reps;30 seconds      Knee/Hip Exercises: Aerobic   Recumbent Bike L3 x 8 minutes      Knee/Hip Exercises: Machines for Strengthening   Cybex Leg Press 50# 2x10      Knee/Hip Exercises: Standing   Heel Raises 15 reps    Heel Raises Limitations with toe raises    Hip ADduction Strengthening;15 reps    Forward  Step Up 15 reps;Step Height: 4";Hand Hold: 1    Other Standing Knee Exercises step tapping on 6 inch step.      Knee/Hip Exercises: Seated   Long Arc Quad Strengthening;Both;Weights;10 reps    Long Arc Quad Weight 2 lbs.    Long [employer] Limitations limited due to right knee popping    Other Seated Knee/Hip Exercises seated SLR x 10 reps; Rt                    PT Short Term Goals - 05/07/20 1123      PT SHORT TERM GOAL #1   Title Pt will be independent in her initial HEP    Status On-going             PT Long Term Goals - 05/07/20 1123      PT LONG TERM GOAL #1   Title Pt will be able to perform 5 time sit to stand in </= 20 seconds with no UE support.    Baseline 32 seconds using UE  support    Status On-going      PT LONG TERM GOAL #2   Title Pt will improve R hip flexion to >/= 4+/5 strength to improve functional mobiltiy.    Status On-going      PT LONG TERM GOAL #3   Title Pt will be able to report being able to stand for >/= 30 minutes with pain </= 3/10 in bilateral knees.    Status On-going      PT LONG TERM GOAL #4   Title Pt will improve her FOTO from 38 to >/= 55.    Status On-going                 Plan - 05/07/20 1118    Clinical Impression Statement Pt tolerating exercises well today with continued focus on eccentric quad  control. Sit to stand and step navigation.  Recommending holding off on open kinetic chain activities due to right knee popping which doesn't occur with closed chain activities. Continue skilled PT to maximize functional mobility.    Personal Factors and Comorbidities Comorbidity 3+    Comorbidities osteopenia, OA, HTN, Baker's cyst right knee    Examination-Activity Limitations Squat;Stairs;Stand;Lift;Transfers    Stability/Clinical Decision Making Evolving/Moderate complexity    Rehab Potential Fair    PT Frequency 2x / week    PT Duration 6 weeks    PT Treatment/Interventions ADLs/Self Care Home  Management;Cryotherapy;Electrical Stimulation;Iontophoresis 4mg /ml Dexamethasone;Moist Heat;Ultrasound;Balance training;Therapeutic exercise;Therapeutic activities;Functional mobility training;Stair training;Gait training;Neuromuscular re-education;Patient/family education;Passive range of motion;Manual techniques;Dry needling;Taping    PT Next Visit Plan Hold off on OKC exercises due to rigth knee popping, bike, LE strengthening, calf and hamstring stretching, eccentric control of quads, stairs/steps    PT Home Exercise Plan Access Code: [email or web address]  URL: [email or web address]  Date: 04/15/2020  Prepared by: Kearney Hard    Exercises  Hooklying Hamstring Stretch with Strap - 2 x daily - 7 x weekly - 5 reps - 30 seconds hold  Supine Bridge - 2 x daily - 7 x weekly - 2 sets - 10 reps - 5 seconds hold  Active Straight Leg Raise with Quad Set - 2 x daily - 7 x weekly - 2 sets - 10 reps  Sit to Stand with Counter Support - 3 x daily - 7 x weekly - 2 sets - 10 reps  Seated March - 2 x daily - 7 x weekly - 2 sets - 10 reps - 3 seconds hold    Consulted and Agree with Plan of Care Patient           Patient will benefit from skilled therapeutic intervention in order to improve the following deficits and impairments:  Pain,Decreased activity tolerance,Decreased range of motion,Decreased strength,Decreased balance,Difficulty walking,Impaired flexibility  Visit Diagnosis: Chronic pain of right knee  Chronic pain of left knee  Difficulty in walking, not elsewhere classified  Weakness     Problem List Patient Active Problem List   Diagnosis Date Noted  . Bilateral primary osteoarthritis of knee 08/16/2019  . Mastodynia, female 06/06/2015  . External hemorrhoid 11/30/2012  . Fibroids 11/28/2012  . Vaginal atrophy 11/28/2012  . H/O vitamin D deficiency 11/28/2012  . Osteopenia 11/28/2012  . Menopause 11/28/2012    Oretha Caprice, PT, MPT 05/07/2020, 11:51 AM  [facility] Physical Therapy [address] [place], [place], [address] Phone: [phone]   Fax:  [phone]  Name: Miranda Gonzalez MRN: [id number] Date of Birth: 1940-04-10

## 2020-05-10 ENCOUNTER — Encounter: Payer: Self-pay | Admitting: Physical Therapy

## 2020-05-10 ENCOUNTER — Telehealth: Payer: Self-pay

## 2020-05-10 ENCOUNTER — Ambulatory Visit (INDEPENDENT_AMBULATORY_CARE_PROVIDER_SITE_OTHER): Payer: Medicare PPO | Admitting: Physical Therapy

## 2020-05-10 ENCOUNTER — Other Ambulatory Visit: Payer: Self-pay

## 2020-05-10 DIAGNOSIS — M25561 Pain in right knee: Secondary | ICD-10-CM | POA: Diagnosis not present

## 2020-05-10 DIAGNOSIS — R262 Difficulty in walking, not elsewhere classified: Secondary | ICD-10-CM | POA: Diagnosis not present

## 2020-05-10 DIAGNOSIS — M25562 Pain in left knee: Secondary | ICD-10-CM

## 2020-05-10 DIAGNOSIS — G8929 Other chronic pain: Secondary | ICD-10-CM

## 2020-05-10 DIAGNOSIS — R531 Weakness: Secondary | ICD-10-CM | POA: Diagnosis not present

## 2020-05-10 NOTE — Therapy (Signed)
[facility] Physical Therapy [address] [place], [place], [address] Phone: [phone]   Fax:  [phone]  Physical Therapy Treatment  Patient Details  Name: Miranda Gonzalez MRN: [id number] Date of Birth: [birth year] Referring Provider (PT): Bevely Palmer Persons,  PA-C   Encounter Date: 05/10/2020   PT End of Session - 05/10/20 1138    Visit Number 5    Number of Visits 12    Date for PT Re-Evaluation 05/31/20    Authorization Type humana    PT Start Time 1105    PT Stop Time 1135   limited by stiffness   PT Time Calculation (min) 30 min    Activity Tolerance Patient tolerated treatment well;Treatment limited secondary to medical complications (Comment)    Behavior During Therapy [facility] for tasks assessed/performed           Past Medical History:  Diagnosis Date  . Fibroid uterus   . Hypertension   . Occipital neuralgia   . Osteopenia 10/2018   T score -1.8 FRAX 5.8% / 1.4%    Past Surgical History:  Procedure Laterality Date  . CATARACT EXTRACTION     Bilateral  . FOOT SURGERY Bilateral   . TUBAL LIGATION      There were no vitals filed for this visit.   Subjective Assessment - 05/10/20 1106    Subjective knees are stiff; woke up with Lt knee pain which has improved with increased activity    Pertinent History osteopenia, HTN, bilateral knee OA, bakers cyst R knee    Patient Stated Goals Walk better, stop hurting    Currently in Pain? Yes    Pain Score 6     Pain Location Knee    Pain Orientation Right;Left    Pain Descriptors / Indicators Aching    Pain Type Chronic pain    Pain Onset More than a month ago    Pain Frequency Constant    Aggravating Factors  bending, touching the back of knee, prolonged standing, descending stairs    Pain Relieving Factors rest, changing positions                             [facility] Adult PT Treatment/Exercise - 05/10/20 1108      Knee/Hip Exercises: Stretches   Passive Hamstring  Stretch Both;3 reps;30 seconds    Passive Hamstring Stretch Limitations seated      Knee/Hip Exercises: Aerobic   Recumbent Bike L3 x 8 minutes      Knee/Hip Exercises: Machines for Strengthening   Cybex Leg Press 50# x 5; 75# 3x10      Knee/Hip Exercises: Standing   Heel Raises Both;20 reps    Functional Squat Limitations attempted - unable to perform properly                    PT Short Term Goals - 05/10/20 1139      PT SHORT TERM GOAL #1   Title Pt will be independent in her initial HEP    Status On-going             PT Long Term Goals - 05/07/20 1123      PT LONG TERM GOAL #1   Title Pt will be able to perform 5 time sit to stand in </= 20 seconds with no UE support.    Baseline 32 seconds using UE support    Status On-going      PT LONG  TERM GOAL #2   Title Pt will improve R hip flexion to >/= 4+/5 strength to improve functional mobiltiy.    Status On-going      PT LONG TERM GOAL #3   Title Pt will be able to report being able to stand for >/= 30 minutes with pain </= 3/10 in bilateral knees.    Status On-going      PT LONG TERM GOAL #4   Title Pt will improve her FOTO from 38 to >/= 55.    Status On-going                 Plan - 05/10/20 1139    Clinical Impression Statement Session limited today by stiffness in bil knees affecting ability to perform certain activiities today.  Will continue to benefit from PT to maximize function.    Personal Factors and Comorbidities Comorbidity 3+    Comorbidities osteopenia, OA, HTN, Baker's cyst right knee    Examination-Activity Limitations Squat;Stairs;Stand;Lift;Transfers    Stability/Clinical Decision Making Evolving/Moderate complexity    Rehab Potential Fair    PT Frequency 2x / week    PT Duration 6 weeks    PT Treatment/Interventions ADLs/Self Care Home Management;Cryotherapy;Electrical Stimulation;Iontophoresis 4mg /ml Dexamethasone;Moist Heat;Ultrasound;Balance training;Therapeutic  exercise;Therapeutic activities;Functional mobility training;Stair training;Gait training;Neuromuscular re-education;Patient/family education;Passive range of motion;Manual techniques;Dry needling;Taping    PT Next Visit Plan Hold off on OKC exercises due to rigth knee popping, bike, LE strengthening, calf and hamstring stretching, eccentric control of quads, stairs/steps    PT Home Exercise Plan Access Code: [email or web address]  URL: [email or web address]  Date: 04/15/2020  Prepared by: Kearney Hard    Exercises  Hooklying Hamstring Stretch with Strap - 2 x daily - 7 x weekly - 5 reps - 30 seconds hold  Supine Bridge - 2 x daily - 7 x weekly - 2 sets - 10 reps - 5 seconds hold  Active Straight Leg Raise with Quad Set - 2 x daily - 7 x weekly - 2 sets - 10 reps  Sit to Stand with Counter Support - 3 x daily - 7 x weekly - 2 sets - 10 reps  Seated March - 2 x daily - 7 x weekly - 2 sets - 10 reps - 3 seconds hold    Consulted and Agree with Plan of Care Patient           Patient will benefit from skilled therapeutic intervention in order to improve the following deficits and impairments:  Pain,Decreased activity tolerance,Decreased range of motion,Decreased strength,Decreased balance,Difficulty walking,Impaired flexibility  Visit Diagnosis: Chronic pain of right knee  Chronic pain of left knee  Difficulty in walking, not elsewhere classified  Weakness     Problem List Patient Active Problem List   Diagnosis Date Noted  . Bilateral primary osteoarthritis of knee 08/16/2019  . Mastodynia, female 06/06/2015  . External hemorrhoid 11/30/2012  . Fibroids 11/28/2012  . Vaginal atrophy 11/28/2012  . H/O vitamin D deficiency 11/28/2012  . Osteopenia 11/28/2012  . Menopause 11/28/2012      Laureen Abrahams, PT, DPT 05/10/20 11:42 AM    [facility] Physical Therapy [address] [place], [place], [address] Phone: [phone]   Fax:  [phone]  Name:  Miranda Gonzalez MRN: [id number] Date of Birth: Mar 23, 1940

## 2020-05-10 NOTE — Telephone Encounter (Signed)
Noted! Thank you

## 2020-05-10 NOTE — Telephone Encounter (Signed)
Uploaded additional  note on [facility] portal for PA for Monovisc, bilateral knee.

## 2020-05-10 NOTE — Telephone Encounter (Signed)
Can you please review message below and advise.? Thanks

## 2020-05-10 NOTE — Telephone Encounter (Signed)
Patient Has tried a self guided strengthening program. She had monovisc injections 8 months ago and they gave her 75% relief . She did not want to try steroid injections with Erin . She is trying to avoid knee replacements

## 2020-05-10 NOTE — Telephone Encounter (Signed)
More information is needed pertaining to patient's last office visit concerning bilateral knee. See below what they are asking.  Thank You   Please provide documentation of Inadequate response to conservative nondrug treatments such as education, strengthening and range of motion exercises, assisted devices, and weight loss. The percent of relief from previous Monovisc injections and over what time period. Medications (eg, nonsteroidal anti-inflammatory drugs [NSAIDs], non-narcotic analgesics, etc.) Steroid injections, & Physical therapy outcome

## 2020-05-13 ENCOUNTER — Ambulatory Visit (INDEPENDENT_AMBULATORY_CARE_PROVIDER_SITE_OTHER): Payer: Medicare PPO | Admitting: Physical Therapy

## 2020-05-13 ENCOUNTER — Other Ambulatory Visit: Payer: Self-pay

## 2020-05-13 ENCOUNTER — Encounter: Payer: Self-pay | Admitting: Physical Therapy

## 2020-05-13 DIAGNOSIS — R531 Weakness: Secondary | ICD-10-CM | POA: Diagnosis not present

## 2020-05-13 DIAGNOSIS — M25562 Pain in left knee: Secondary | ICD-10-CM | POA: Diagnosis not present

## 2020-05-13 DIAGNOSIS — M25561 Pain in right knee: Secondary | ICD-10-CM

## 2020-05-13 DIAGNOSIS — G8929 Other chronic pain: Secondary | ICD-10-CM

## 2020-05-13 DIAGNOSIS — R262 Difficulty in walking, not elsewhere classified: Secondary | ICD-10-CM | POA: Diagnosis not present

## 2020-05-13 NOTE — Therapy (Signed)
[facility] [place], [place], [address] Phone: [phone]   Fax:  [phone]  Physical Therapy Treatment  Patient Details  Name: Miranda Gonzalez MRN: [id number] Date of Birth: 12-08-1940 Referring Provider (PT): Bevely Palmer Persons,  PA-C   Encounter Date: 05/13/2020   PT End of Session - 05/13/20 1116    Visit Number 6    Number of Visits 12    Date for PT Re-Evaluation 05/31/20    Authorization Type humana    PT Start Time 1102    PT Stop Time 1142    PT Time Calculation (min) 40 min    Activity Tolerance Patient tolerated treatment well;Treatment limited secondary to medical complications (Comment)    Behavior During Therapy [facility] for tasks assessed/performed           Past Medical History:  Diagnosis Date  . Fibroid uterus   . Hypertension   . Occipital neuralgia   . Osteopenia 10/2018   T score -1.8 FRAX 5.8% / 1.4%    Past Surgical History:  Procedure Laterality Date  . CATARACT EXTRACTION     Bilateral  . FOOT SURGERY Bilateral   . TUBAL LIGATION      There were no vitals filed for this visit.   Subjective Assessment - 05/13/20 1113    Subjective Pt arriving reporting 8/10 pain in bilateral knees    Pertinent History osteopenia, HTN, bilateral knee OA, bakers cyst R knee    Patient Stated Goals Walk better, stop hurting    Currently in Pain? Yes    Pain Score 8     Pain Location Knee    Pain Orientation Right;Left    Pain Descriptors / Indicators Aching;Sore    Pain Type Chronic pain    Pain Onset More than a month ago                             [facility] Adult PT Treatment/Exercise - 05/13/20 0001      Knee/Hip Exercises: Stretches   Passive Hamstring Stretch Both;3 reps;30 seconds    Passive Hamstring Stretch Limitations seated    Gastroc Stretch 2 reps;Both    Gastroc Stretch Limitations slant board      Knee/Hip Exercises: Aerobic   Recumbent Bike L3 x 8 minutes       Knee/Hip Exercises: Machines for Strengthening   Cybex Leg Press 50# x 5; 75# 3x10      Knee/Hip Exercises: Standing   Heel Raises Both;20 reps    Other Standing Knee Exercises TRX squats x 5 with chair behind for safety      Knee/Hip Exercises: Seated   Sit to Sand 10 reps;with UE support   using single UE support only                   PT Short Term Goals - 05/13/20 1127      PT SHORT TERM GOAL #1   Title Pt will be independent in her initial HEP    Status On-going             PT Long Term Goals - 05/13/20 1121      PT LONG TERM GOAL #1   Title Pt will be able to perform 5 time sit to stand in </= 20 seconds with no UE support.    Status On-going      PT LONG TERM GOAL #2   Title Pt will improve  R hip flexion to >/= 4+/5 strength to improve functional mobiltiy.    Status On-going      PT LONG TERM GOAL #3   Title Pt will be able to report being able to stand for >/= 30 minutes with pain </= 3/10 in bilateral knees.    Status On-going      PT LONG TERM GOAL #4   Title Pt will improve her FOTO from 38 to >/= 55.    Status On-going                 Plan - 05/13/20 1118    Clinical Impression Statement Pt still with limitations of bilateral knee stiffness and decreasd ROM. Pt reporting 8/10 pain in bilateral knees. Pt tolreating exercises with corrections in technique. Focusing on CKC activities due to painful knee popping in right knee.  Continue to progress ROM and strength to maximize function.    Personal Factors and Comorbidities Comorbidity 3+    Comorbidities osteopenia, OA, HTN, Baker's cyst right knee    Examination-Activity Limitations Squat;Stairs;Stand;Lift;Transfers    Examination-Participation Restrictions Driving;Community Activity;Other    Stability/Clinical Decision Making Evolving/Moderate complexity    Rehab Potential Fair    PT Frequency 2x / week    PT Duration 6 weeks    PT Treatment/Interventions ADLs/Self Care Home  Management;Cryotherapy;Electrical Stimulation;Iontophoresis 4mg /ml Dexamethasone;Moist Heat;Ultrasound;Balance training;Therapeutic exercise;Therapeutic activities;Functional mobility training;Stair training;Gait training;Neuromuscular re-education;Patient/family education;Passive range of motion;Manual techniques;Dry needling;Taping    PT Next Visit Plan Hold off on OKC exercises due to rigth knee popping, bike, LE strengthening, calf and hamstring stretching, eccentric control of quads, stairs/steps    PT Home Exercise Plan Access Code: [email or web address]  URL: [email or web address]  Date: 04/15/2020  Prepared by: Kearney Hard    Exercises  Hooklying Hamstring Stretch with Strap - 2 x daily - 7 x weekly - 5 reps - 30 seconds hold  Supine Bridge - 2 x daily - 7 x weekly - 2 sets - 10 reps - 5 seconds hold  Active Straight Leg Raise with Quad Set - 2 x daily - 7 x weekly - 2 sets - 10 reps  Sit to Stand with Counter Support - 3 x daily - 7 x weekly - 2 sets - 10 reps  Seated March - 2 x daily - 7 x weekly - 2 sets - 10 reps - 3 seconds hold    Consulted and Agree with Plan of Care Patient           Patient will benefit from skilled therapeutic intervention in order to improve the following deficits and impairments:  Pain,Decreased activity tolerance,Decreased range of motion,Decreased strength,Decreased balance,Difficulty walking,Impaired flexibility  Visit Diagnosis: Chronic pain of right knee  Chronic pain of left knee  Difficulty in walking, not elsewhere classified  Weakness     Problem List Patient Active Problem List   Diagnosis Date Noted  . Bilateral primary osteoarthritis of knee 08/16/2019  . Mastodynia, female 06/06/2015  . External hemorrhoid 11/30/2012  . Fibroids 11/28/2012  . Vaginal atrophy 11/28/2012  . H/O vitamin D deficiency 11/28/2012  . Osteopenia 11/28/2012  . Menopause 11/28/2012    Oretha Caprice , PT, MPT 05/13/2020, 11:34 AM  [facility] Physical Therapy [address] [place], [place], [address] Phone: [phone]   Fax:  [phone]  Name: Miranda Gonzalez MRN: [id number] Date of Birth: [birth year]

## 2020-05-15 ENCOUNTER — Encounter: Payer: Self-pay | Admitting: Physical Therapy

## 2020-05-15 ENCOUNTER — Other Ambulatory Visit: Payer: Self-pay

## 2020-05-15 ENCOUNTER — Ambulatory Visit: Payer: Medicare PPO | Admitting: Physical Therapy

## 2020-05-15 DIAGNOSIS — R531 Weakness: Secondary | ICD-10-CM

## 2020-05-15 DIAGNOSIS — R262 Difficulty in walking, not elsewhere classified: Secondary | ICD-10-CM

## 2020-05-15 DIAGNOSIS — G8929 Other chronic pain: Secondary | ICD-10-CM

## 2020-05-15 DIAGNOSIS — M25561 Pain in right knee: Secondary | ICD-10-CM | POA: Diagnosis not present

## 2020-05-15 DIAGNOSIS — M25562 Pain in left knee: Secondary | ICD-10-CM | POA: Diagnosis not present

## 2020-05-15 NOTE — Therapy (Signed)
[facility] [place], [place], [address] Phone: [phone]   Fax:  [phone]  Physical Therapy Treatment  Patient Details  Name: Miranda Gonzalez MRN: [id number] Date of Birth: 14-Jan-1940 Referring Provider (PT): Bevely Palmer Persons,  PA-C   Encounter Date: 05/15/2020   PT End of Session - 05/15/20 1026    Visit Number 7    Number of Visits 12    Date for PT Re-Evaluation 05/31/20    Authorization Type humana    PT Start Time 1021    PT Stop Time 1059    PT Time Calculation (min) 38 min    Activity Tolerance Patient tolerated treatment well;Treatment limited secondary to medical complications (Comment)    Behavior During Therapy [facility] for tasks assessed/performed           Past Medical History:  Diagnosis Date  . Fibroid uterus   . Hypertension   . Occipital neuralgia   . Osteopenia 10/2018   T score -1.8 FRAX 5.8% / 1.4%    Past Surgical History:  Procedure Laterality Date  . CATARACT EXTRACTION     Bilateral  . FOOT SURGERY Bilateral   . TUBAL LIGATION      There were no vitals filed for this visit.   Subjective Assessment - 05/15/20 1022    Subjective Pt arriving reporting 8/10 pain in right knee and 5/10 pain in left knee. Pt reporting difficulty with stooping down and sit to stand at home with bearing weight through her legs. Pt reporting her cramping at night has improved over the last few days.    Pertinent History osteopenia, HTN, bilateral knee OA, bakers cyst R knee    Patient Stated Goals Walk better, stop hurting    Currently in Pain? Yes    Pain Score 8     Pain Location Knee    Pain Orientation Right    Pain Descriptors / Indicators Aching;Sore;Tightness   5/10 on left knee   Pain Type Chronic pain    Pain Onset More than a month ago    Pain Frequency Constant                             [facility] Adult PT Treatment/Exercise - 05/15/20 0001      Knee/Hip Exercises: Stretches    Gastroc Stretch 2 reps;Both    Gastroc Stretch Limitations slant board      Knee/Hip Exercises: Aerobic   Nustep L4 x 10 minutes      Knee/Hip Exercises: Machines for Strengthening   Cybex Leg Press 50# x 10, 75# x 10, 87# x 10   bilateral LE"S     Knee/Hip Exercises: Standing   Other Standing Knee Exercises TRX squats x 5 with chair behind for safety      Knee/Hip Exercises: Seated   Other Seated Knee/Hip Exercises seated SLR x 10 each LE    Sit to Sand 10 reps;with UE support;2 sets   using single UE support only, 10 reps c each UE                   PT Short Term Goals - 05/15/20 1029      PT SHORT TERM GOAL #1   Title Pt will be independent in her initial HEP    Status Achieved             PT Long Term Goals - 05/15/20 1030  PT LONG TERM GOAL #1   Title Pt will be able to perform 5 time sit to stand in </= 20 seconds with no UE support.    Status On-going      PT LONG TERM GOAL #2   Title Pt will improve R hip flexion to >/= 4+/5 strength to improve functional mobiltiy.    Status On-going      PT LONG TERM GOAL #3   Title Pt will be able to report being able to stand for >/= 30 minutes with pain </= 3/10 in bilateral knees.    Status On-going      PT LONG TERM GOAL #4   Title Pt will improve her FOTO from 38 to >/= 55.    Status On-going                 Plan - 05/15/20 1027    Clinical Impression Statement Pt arriving to therapy reporting 8/10 pain in her right knee and 5/10 pain in her left knee. Pt tolerating exercises with fatigue reported and more lateral right knee pain today. Focusing on CKC exercises to prevent popping of right knee. Pt is progressing slowing with strength and ROM. Continue skilled PT to mazimize function.    Personal Factors and Comorbidities Comorbidity 3+    Comorbidities osteopenia, OA, HTN, Baker's cyst right knee    Examination-Activity Limitations Squat;Stairs;Stand;Lift;Transfers    Examination-Participation  Restrictions Driving;Community Activity;Other    Stability/Clinical Decision Making Evolving/Moderate complexity    Rehab Potential Fair    PT Frequency 2x / week    PT Duration 6 weeks    PT Treatment/Interventions ADLs/Self Care Home Management;Cryotherapy;Electrical Stimulation;Iontophoresis 4mg /ml Dexamethasone;Moist Heat;Ultrasound;Balance training;Therapeutic exercise;Therapeutic activities;Functional mobility training;Stair training;Gait training;Neuromuscular re-education;Patient/family education;Passive range of motion;Manual techniques;Dry needling;Taping    PT Next Visit Plan Hold off on OKC exercises due to rigth knee popping, bike, LE strengthening, calf and hamstring stretching, eccentric control of quads, stairs/steps    PT Home Exercise Plan Access Code: [email or web address]  URL: [email or web address]  Date: 04/15/2020  Prepared by: Kearney Hard    Exercises  Hooklying Hamstring Stretch with Strap - 2 x daily - 7 x weekly - 5 reps - 30 seconds hold  Supine Bridge - 2 x daily - 7 x weekly - 2 sets - 10 reps - 5 seconds hold  Active Straight Leg Raise with Quad Set - 2 x daily - 7 x weekly - 2 sets - 10 reps  Sit to Stand with Counter Support - 3 x daily - 7 x weekly - 2 sets - 10 reps  Seated March - 2 x daily - 7 x weekly - 2 sets - 10 reps - 3 seconds hold    Consulted and Agree with Plan of Care Patient           Patient will benefit from skilled therapeutic intervention in order to improve the following deficits and impairments:  Pain,Decreased activity tolerance,Decreased range of motion,Decreased strength,Decreased balance,Difficulty walking,Impaired flexibility  Visit Diagnosis: Chronic pain of right knee  Chronic pain of left knee  Difficulty in walking, not elsewhere classified  Weakness     Problem List Patient Active Problem List   Diagnosis Date Noted  . Bilateral primary osteoarthritis of knee 08/16/2019  . Mastodynia, female 06/06/2015  . External  hemorrhoid 11/30/2012  . Fibroids 11/28/2012  . Vaginal atrophy 11/28/2012  . H/O vitamin D deficiency 11/28/2012  . Osteopenia 11/28/2012  . Menopause 11/28/2012    Oretha Caprice, PT, MPT 05/15/2020,  10:47 AM  [facility] Physical Therapy [address] [place], [place], [address] Phone: [phone]   Fax:  [phone]  Name: MARRI MCNEFF MRN: [id number] Date of Birth: [birth year]

## 2020-05-20 ENCOUNTER — Other Ambulatory Visit: Payer: Self-pay

## 2020-05-20 ENCOUNTER — Encounter: Payer: Self-pay | Admitting: Physical Therapy

## 2020-05-20 ENCOUNTER — Ambulatory Visit (INDEPENDENT_AMBULATORY_CARE_PROVIDER_SITE_OTHER): Payer: Medicare PPO | Admitting: Physical Therapy

## 2020-05-20 DIAGNOSIS — M25562 Pain in left knee: Secondary | ICD-10-CM | POA: Diagnosis not present

## 2020-05-20 DIAGNOSIS — R262 Difficulty in walking, not elsewhere classified: Secondary | ICD-10-CM | POA: Diagnosis not present

## 2020-05-20 DIAGNOSIS — M25561 Pain in right knee: Secondary | ICD-10-CM | POA: Diagnosis not present

## 2020-05-20 DIAGNOSIS — R531 Weakness: Secondary | ICD-10-CM | POA: Diagnosis not present

## 2020-05-20 DIAGNOSIS — G8929 Other chronic pain: Secondary | ICD-10-CM

## 2020-05-20 NOTE — Therapy (Signed)
[facility] Physical Therapy [address] [place], [place], [address] Phone: [phone]   Fax:  [phone]  Physical Therapy Treatment  Patient Details  Name: Miranda Gonzalez MRN: [id number] Date of Birth: 1940-11-21 Referring Provider (PT): Bevely Palmer Persons,  PA-C   Encounter Date: 05/20/2020   PT End of Session - 05/20/20 1112    Visit Number 8    Number of Visits 12    Date for PT Re-Evaluation 05/31/20    Authorization Type humana    PT Start Time 1104    PT Stop Time 1142    PT Time Calculation (min) 38 min    Activity Tolerance Patient tolerated treatment well;Treatment limited secondary to medical complications (Comment)    Behavior During Therapy [facility] for tasks assessed/performed           Past Medical History:  Diagnosis Date  . Fibroid uterus   . Hypertension   . Occipital neuralgia   . Osteopenia 10/2018   T score -1.8 FRAX 5.8% / 1.4%    Past Surgical History:  Procedure Laterality Date  . CATARACT EXTRACTION     Bilateral  . FOOT SURGERY Bilateral   . TUBAL LIGATION      There were no vitals filed for this visit.   Subjective Assessment - 05/20/20 1110    Subjective Pt arriving today reporting no pain when she first woke up this morning. Pt reporting 5/10 pain in her knees today. Pt reproting having to take advil yesterday due to pain.    Pertinent History osteopenia, HTN, bilateral knee OA, bakers cyst R knee    Patient Stated Goals Walk better, stop hurting    Currently in Pain? Yes    Pain Score 5     Pain Location Knee    Pain Orientation Right;Left    Pain Descriptors / Indicators Aching;Sore    Pain Type Chronic pain    Pain Onset More than a month ago                             [facility] Adult PT Treatment/Exercise - 05/20/20 0001      Knee/Hip Exercises: Aerobic   Recumbent Bike L4 x 10 minutes      Knee/Hip Exercises: Machines for Strengthening   Cybex Leg Press 81# double LE x15, sinlge LE  31# 2x10 each LE      Knee/Hip Exercises: Standing   Heel Raises 15 reps    Forward Step Up 10 reps;Step Height: 6"   leading with each LE with single UE support   Other Standing Knee Exercises TRX squats x 5 with chair behind for safety      Knee/Hip Exercises: Seated   Other Seated Knee/Hip Exercises seated SLR x 10 each LE    Sit to Sand 10 reps;with UE support;2 sets   using single UE support only, 10 reps c each UE                   PT Short Term Goals - 05/15/20 1029      PT SHORT TERM GOAL #1   Title Pt will be independent in her initial HEP    Status Achieved             PT Long Term Goals - 05/20/20 1119      PT LONG TERM GOAL #1   Title Pt will be able to perform 5 time sit to stand in </=  20 seconds with no UE support.    Status On-going      PT LONG TERM GOAL #2   Title Pt will improve R hip flexion to >/= 4+/5 strength to improve functional mobiltiy.    Status On-going      PT LONG TERM GOAL #3   Title Pt will be able to report being able to stand for >/= 30 minutes with pain </= 3/10 in bilateral knees.    Status On-going      PT LONG TERM GOAL #4   Title Pt will improve her FOTO from 38 to >/= 55.    Status On-going                 Plan - 05/20/20 1113    Clinical Impression Statement Pt arriving today reporting 5/10 pain in her knees. Pt reporting waking up this morning with no pain. Pt tolerating CKC exercises well focusing on ROM and strengthening. Continue skilled PT to mazimixe function.    Personal Factors and Comorbidities Comorbidity 3+    Comorbidities osteopenia, OA, HTN, Baker's cyst right knee    Examination-Activity Limitations Squat;Stairs;Stand;Lift;Transfers    Examination-Participation Restrictions Driving;Community Activity;Other    Stability/Clinical Decision Making Evolving/Moderate complexity    Rehab Potential Fair    PT Frequency 2x / week    PT Duration 6 weeks    PT Treatment/Interventions ADLs/Self Care  Home Management;Cryotherapy;Electrical Stimulation;Iontophoresis 4mg /ml Dexamethasone;Moist Heat;Ultrasound;Balance training;Therapeutic exercise;Therapeutic activities;Functional mobility training;Stair training;Gait training;Neuromuscular re-education;Patient/family education;Passive range of motion;Manual techniques;Dry needling;Taping    PT Next Visit Plan Hold off on OKC exercises due to rigth knee popping, bike, LE strengthening, calf and hamstring stretching, eccentric control of quads, stairs/steps    PT Home Exercise Plan Access Code: [email or web address]  URL: [email or web address]  Date: 04/15/2020  Prepared by: Kearney Hard    Exercises  Hooklying Hamstring Stretch with Strap - 2 x daily - 7 x weekly - 5 reps - 30 seconds hold  Supine Bridge - 2 x daily - 7 x weekly - 2 sets - 10 reps - 5 seconds hold  Active Straight Leg Raise with Quad Set - 2 x daily - 7 x weekly - 2 sets - 10 reps  Sit to Stand with Counter Support - 3 x daily - 7 x weekly - 2 sets - 10 reps  Seated March - 2 x daily - 7 x weekly - 2 sets - 10 reps - 3 seconds hold    Consulted and Agree with Plan of Care Patient           Patient will benefit from skilled therapeutic intervention in order to improve the following deficits and impairments:  Pain,Decreased activity tolerance,Decreased range of motion,Decreased strength,Decreased balance,Difficulty walking,Impaired flexibility  Visit Diagnosis: Chronic pain of right knee  Chronic pain of left knee  Difficulty in walking, not elsewhere classified  Weakness     Problem List Patient Active Problem List   Diagnosis Date Noted  . Bilateral primary osteoarthritis of knee 08/16/2019  . Mastodynia, female 06/06/2015  . External hemorrhoid 11/30/2012  . Fibroids 11/28/2012  . Vaginal atrophy 11/28/2012  . H/O vitamin D deficiency 11/28/2012  . Osteopenia 11/28/2012  . Menopause 11/28/2012    Oretha Caprice, PT, MPT 05/20/2020, 11:30 AM  [facility] Physical Therapy [address] [place], [place], [address] Phone: [phone]   Fax:  [phone]  Name: Miranda Gonzalez MRN: [id number] Date of Birth: 06-17-1940

## 2020-05-23 ENCOUNTER — Encounter: Payer: Self-pay | Admitting: Physical Therapy

## 2020-05-23 ENCOUNTER — Other Ambulatory Visit: Payer: Self-pay

## 2020-05-23 ENCOUNTER — Telehealth: Payer: Self-pay

## 2020-05-23 ENCOUNTER — Ambulatory Visit: Payer: Medicare PPO | Admitting: Physical Therapy

## 2020-05-23 DIAGNOSIS — R262 Difficulty in walking, not elsewhere classified: Secondary | ICD-10-CM | POA: Diagnosis not present

## 2020-05-23 DIAGNOSIS — G8929 Other chronic pain: Secondary | ICD-10-CM

## 2020-05-23 DIAGNOSIS — M25562 Pain in left knee: Secondary | ICD-10-CM | POA: Diagnosis not present

## 2020-05-23 DIAGNOSIS — R531 Weakness: Secondary | ICD-10-CM | POA: Diagnosis not present

## 2020-05-23 DIAGNOSIS — M25561 Pain in right knee: Secondary | ICD-10-CM | POA: Diagnosis not present

## 2020-05-23 NOTE — Telephone Encounter (Signed)
PA resubmitted online to [place] for Monovisc, bilateral knee. Pending PA# [id number]

## 2020-05-23 NOTE — Therapy (Signed)
[facility] [place], [place], [address] Phone: [phone]   Fax:  [phone]  Physical Therapy Treatment  Patient Details  Name: Miranda Gonzalez MRN: [id number] Date of Birth: 06-May-1940 Referring Provider (PT): Bevely Palmer Persons,  PA-C   Encounter Date: 05/23/2020   PT End of Session - 05/23/20 1243    Visit Number 9    Number of Visits 12    Date for PT Re-Evaluation 05/31/20    Authorization Type humana    PT Start Time 1150    PT Stop Time 1228    PT Time Calculation (min) 38 min    Activity Tolerance Patient tolerated treatment well;Treatment limited secondary to medical complications (Comment)    Behavior During Therapy [facility] for tasks assessed/performed           Past Medical History:  Diagnosis Date  . Fibroid uterus   . Hypertension   . Occipital neuralgia   . Osteopenia 10/2018   T score -1.8 FRAX 5.8% / 1.4%    Past Surgical History:  Procedure Laterality Date  . CATARACT EXTRACTION     Bilateral  . FOOT SURGERY Bilateral   . TUBAL LIGATION      There were no vitals filed for this visit.   Subjective Assessment - 05/23/20 1152    Subjective knees are gradually improving, pain today about a 3/10    Pertinent History osteopenia, HTN, bilateral knee OA, bakers cyst R knee    Patient Stated Goals Walk better, stop hurting    Currently in Pain? Yes    Pain Score 3     Pain Location Knee    Pain Orientation Right;Left    Pain Descriptors / Indicators Aching;Sore    Pain Type Chronic pain    Pain Onset More than a month ago    Pain Frequency Constant    Aggravating Factors  bending, touching the back of knee, prolonged standing, descending stairs    Pain Relieving Factors rest, changing positions                             [facility] Adult PT Treatment/Exercise - 05/23/20 1153      Knee/Hip Exercises: Stretches   Passive Hamstring Stretch Both;3 reps;30 seconds    Passive  Hamstring Stretch Limitations seated    Gastroc Stretch 2 reps;Both    Gastroc Stretch Limitations slant board      Knee/Hip Exercises: Aerobic   Recumbent Bike L4 x 10 minutes      Knee/Hip Exercises: Machines for Strengthening   Cybex Leg Press 81# double LE 2x10, sinlge LE 43# 2x10 RLE, x7 LLE, then 31# x 3 reps      Knee/Hip Exercises: Standing   Heel Raises Both;20 reps    Heel Raises Limitations slantboard      Knee/Hip Exercises: Seated   Other Seated Knee/Hip Exercises seated SLR x 15 each LE                    PT Short Term Goals - 05/15/20 1029      PT SHORT TERM GOAL #1   Title Pt will be independent in her initial HEP    Status Achieved             PT Long Term Goals - 05/20/20 1119      PT LONG TERM GOAL #1   Title Pt will be able to perform 5 time sit  to stand in </= 20 seconds with no UE support.    Status On-going      PT LONG TERM GOAL #2   Title Pt will improve R hip flexion to >/= 4+/5 strength to improve functional mobiltiy.    Status On-going      PT LONG TERM GOAL #3   Title Pt will be able to report being able to stand for >/= 30 minutes with pain </= 3/10 in bilateral knees.    Status On-going      PT LONG TERM GOAL #4   Title Pt will improve her FOTO from 38 to >/= 55.    Status On-going                 Plan - 05/23/20 1243    Clinical Impression Statement Pt tolerated session well today, with some expected fatigue with increase in weight on leg press.  Progressing well with PT, and reporting gradual decrease in pain with PT.    Personal Factors and Comorbidities Comorbidity 3+    Comorbidities osteopenia, OA, HTN, Baker's cyst right knee    Examination-Activity Limitations Squat;Stairs;Stand;Lift;Transfers    Examination-Participation Restrictions Driving;Community Activity;Other    Stability/Clinical Decision Making Evolving/Moderate complexity    Rehab Potential Fair    PT Frequency 2x / week    PT Duration 6  weeks    PT Treatment/Interventions ADLs/Self Care Home Management;Cryotherapy;Electrical Stimulation;Iontophoresis 4mg /ml Dexamethasone;Moist Heat;Ultrasound;Balance training;Therapeutic exercise;Therapeutic activities;Functional mobility training;Stair training;Gait training;Neuromuscular re-education;Patient/family education;Passive range of motion;Manual techniques;Dry needling;Taping    PT Next Visit Plan Hold off on OKC exercises due to rigth knee popping, bike, LE strengthening, calf and hamstring stretching, eccentric control of quads, stairs/steps, needs progress note    PT Home Exercise Plan Access Code: [email or web address]    Consulted and Agree with Plan of Care Patient           Patient will benefit from skilled therapeutic intervention in order to improve the following deficits and impairments:  Pain,Decreased activity tolerance,Decreased range of motion,Decreased strength,Decreased balance,Difficulty walking,Impaired flexibility  Visit Diagnosis: Chronic pain of right knee  Chronic pain of left knee  Difficulty in walking, not elsewhere classified  Weakness     Problem List Patient Active Problem List   Diagnosis Date Noted  . Bilateral primary osteoarthritis of knee 08/16/2019  . Mastodynia, female 06/06/2015  . External hemorrhoid 11/30/2012  . Fibroids 11/28/2012  . Vaginal atrophy 11/28/2012  . H/O vitamin D deficiency 11/28/2012  . Osteopenia 11/28/2012  . Menopause 11/28/2012      Laureen Abrahams, PT, DPT 05/23/20 12:45 PM    [facility] [address] [place], [place], [address] Phone: [phone]   Fax:  [phone]  Name: Miranda Gonzalez MRN: [id number] Date of Birth: 1940-12-17

## 2020-05-28 ENCOUNTER — Telehealth: Payer: Self-pay

## 2020-05-28 NOTE — Telephone Encounter (Signed)
PA still pending for Monovisc, bilateral knee for [facility].

## 2020-05-30 ENCOUNTER — Telehealth: Payer: Self-pay

## 2020-05-30 NOTE — Telephone Encounter (Signed)
Called and left a VM advising patient to CB concerning some additional information that is needed for PA for Monovisc, bilateral knee.

## 2020-06-07 ENCOUNTER — Telehealth: Payer: Self-pay

## 2020-06-07 NOTE — Telephone Encounter (Signed)
PA for Monovisc, bilateral knee was denied.  A written appeal can be submitted to [facility], if Dr. Sharol Given would like.  Please advise.  Thank you.

## 2020-06-10 ENCOUNTER — Ambulatory Visit (INDEPENDENT_AMBULATORY_CARE_PROVIDER_SITE_OTHER): Payer: Medicare PPO | Admitting: Physical Therapy

## 2020-06-10 ENCOUNTER — Other Ambulatory Visit: Payer: Self-pay

## 2020-06-10 ENCOUNTER — Encounter: Payer: Self-pay | Admitting: Physical Therapy

## 2020-06-10 DIAGNOSIS — M25562 Pain in left knee: Secondary | ICD-10-CM | POA: Diagnosis not present

## 2020-06-10 DIAGNOSIS — G8929 Other chronic pain: Secondary | ICD-10-CM

## 2020-06-10 DIAGNOSIS — R531 Weakness: Secondary | ICD-10-CM

## 2020-06-10 DIAGNOSIS — R262 Difficulty in walking, not elsewhere classified: Secondary | ICD-10-CM | POA: Diagnosis not present

## 2020-06-10 DIAGNOSIS — M25561 Pain in right knee: Secondary | ICD-10-CM | POA: Diagnosis not present

## 2020-06-10 NOTE — Addendum Note (Signed)
Addended by: Kearney Hard R on: 06/10/2020 12:21 PM   Modules accepted: Orders

## 2020-06-10 NOTE — Therapy (Signed)
[facility] [place] [place], [place], [address] Phone: [phone]   Fax:  [phone]  Physical Therapy Treatment Progress note  Patient Details  Name: Miranda Gonzalez MRN: [id number] Date of Birth: 04-Feb-1940 Referring Provider (PT): Bevely Palmer Persons PA-C   Encounter Date: 06/10/2020   PT End of Session - 06/10/20 1115    Visit Number 10    Number of Visits 13    Date for PT Re-Evaluation 06/21/20    Authorization Type humana    submitted for 4 additional visits from 05/31/2020 to 06/21/2020 on 06/10/2020    PT Start Time 1103    PT Stop Time 1145    PT Time Calculation (min) 42 min    Activity Tolerance Patient tolerated treatment well;Treatment limited secondary to medical complications (Comment)    Behavior During Therapy [facility] for tasks assessed/performed           Past Medical History:  Diagnosis Date  . Fibroid uterus   . Hypertension   . Occipital neuralgia   . Osteopenia 10/2018   T score -1.8 FRAX 5.8% / 1.4%    Past Surgical History:  Procedure Laterality Date  . CATARACT EXTRACTION     Bilateral  . FOOT SURGERY Bilateral   . TUBAL LIGATION      There were no vitals filed for this visit.   Subjective Assessment - 06/10/20 1108    Subjective Pt reporting not doing too well when visiting her daughter's home with 2 flights of stairs. Pt reporting her right knee buckled pt thankful she was on the bottom step. Pt stating her daughter's steps were steeped than hers at home.    Pertinent History osteopenia, HTN, bilateral knee OA, bakers cyst R knee    Patient Stated Goals Walk better, stop hurting    Currently in Pain? Yes    Pain Score 6     Pain Location Knee    Pain Orientation Right;Left    Pain Descriptors / Indicators Aching;Sore    Pain Type Chronic pain    Pain Onset More than a month ago              [facility] PT Assessment - 06/10/20 0001      Assessment   Medical Diagnosis M17.0 Primary OA bilateral  knees    Referring Provider (PT) Bevely Palmer Persons PA-C      AROM   AROM Assessment Site Knee    Right Knee Extension 4    Right Knee Flexion 120    Left Knee Extension 4    Left Knee Flexion 125      Palpation   Palpation comment Pt with tenderness over lateral joint line bilaterally      Transfers   Five time sit to stand comments  18 seconds with UE support                         [facility] Adult PT Treatment/Exercise - 06/10/20 0001      Knee/Hip Exercises: Stretches   Passive Hamstring Stretch Both;3 reps;30 seconds    Passive Hamstring Stretch Limitations seated      Knee/Hip Exercises: Aerobic   Recumbent Bike L4 x 13 minutes      Knee/Hip Exercises: Machines for Strengthening   Cybex Leg Press 81# double LE 2x10, sinlge LE 43# 2x10 RLE, x7 LLE, then 31# x 3 reps      Knee/Hip Exercises: Seated   Other Seated Knee/Hip Exercises seated  SLR x 15 each LE      Manual Therapy   Manual therapy comments STM: bilateral quads, PROM in tailgait position                    PT Short Term Goals - 06/10/20 1130      PT SHORT TERM GOAL #1   Title Pt will be independent in her initial HEP    Time 3    Status Achieved             PT Long Term Goals - 06/10/20 1131      PT LONG TERM GOAL #1   Title Pt will be able to perform 5 time sit to stand in </= 20 seconds with no UE support.    Baseline 18 seconds using UE support    Status Achieved      PT LONG TERM GOAL #2   Title Pt will improve R hip flexion to >/= 4+/5 strength to improve functional mobiltiy.    Status On-going      PT LONG TERM GOAL #3   Title Pt will be able to report being able to stand for >/= 30 minutes with pain </= 3/10 in bilateral knees.    Baseline Pt reporting she is able to walk around more than 30 minutes, but is unable to just stand without leaning on something to assist her. Pt repoting 7/10 pain when beginning to move after standing.    Status On-going      PT LONG  TERM GOAL #4   Title Pt will improve her FOTO from 38 to >/= 55.    Status On-going                 Plan - 06/10/20 1126    Clinical Impression Statement Pt reporting rough visit at her daughters over the last 2 weeks. I have requested extension of visits and date from [facility] due to 2 weeks pt was on vacation. Pt reporting episode of her knee buckeling when climbing the stairs and reports extreme fear of falling. Pt still progressing with LE strengthening and funtional mobility. Pt also stating she is trying to schedule gel injections in bilateral knees. Continue skilled PT progressing toward LTG's set.    Personal Factors and Comorbidities Comorbidity 3+    Comorbidities osteopenia, OA, HTN, Baker's cyst right knee    Examination-Activity Limitations Squat;Stairs;Stand;Lift;Transfers    Examination-Participation Restrictions Driving;Community Activity;Other    Stability/Clinical Decision Making Evolving/Moderate complexity    Rehab Potential Fair    PT Frequency 2x / week    PT Duration 6 weeks    PT Treatment/Interventions ADLs/Self Care Home Management;Cryotherapy;Electrical Stimulation;Iontophoresis 4mg /ml Dexamethasone;Moist Heat;Ultrasound;Balance training;Therapeutic exercise;Therapeutic activities;Functional mobility training;Stair training;Gait training;Neuromuscular re-education;Patient/family education;Passive range of motion;Manual techniques;Dry needling;Taping    PT Next Visit Plan Hold off on OKC exercises due to rigth knee popping, bike, LE strengthening, calf and hamstring stretching, eccentric control of quads, stairs/steps    PT Home Exercise Plan Access Code: [email or web address]    Consulted and Agree with Plan of Care Patient           Patient will benefit from skilled therapeutic intervention in order to improve the following deficits and impairments:  Pain,Decreased activity tolerance,Decreased range of motion,Decreased strength,Decreased balance,Difficulty  walking,Impaired flexibility  Visit Diagnosis: Chronic pain of right knee  Chronic pain of left knee  Difficulty in walking, not elsewhere classified  Weakness  Referring diagnosis? M17.0  Treatment diagnosis? (if different than referring diagnosis) m25.561,  M25.562, R26.2, R53.1 What was this (referring dx) caused by? []  Surgery []  Fall []  Ongoing issue [x]  Arthritis []  Other: ____________  Laterality: []  Rt []  Lt [x]  Both  Check all possible CPT codes:      [x]  97110 (Therapeutic Exercise)  []  92507 (SLP Treatment)  [x]  86381 (Neuro Re-ed)   []  92526 (Swallowing Treatment)   [x]  97116 (Gait Training)   []  [id number] (Cognitive Training, 1st 15 minutes) [x]  97140 (Manual Therapy)   []  97130 (Cognitive Training, each add'l 15 minutes)  [x]  97530 (Therapeutic Activities)  []  Other, List CPT Code ____________    [x]  77116 (Self Care)       []  All codes above (97110 - 97535)  []  97012 (Mechanical Traction)  []  97014 (E-stim Unattended)  []  97032 (E-stim manual)  []  97033 (Ionto)  []  97035 (Ultrasound)  []  97760 (Orthotic Fit) [x]  97750 (Physical Performance Training) []  [id number] (Aquatic Therapy) []  97034 (Contrast Bath) []  [id number] (Paraffin) []  97597 (Wound Care 1st 20 sq cm) []  97598 (Wound Care each add'l 20 sq cm) []  97016 (Vasopneumatic Device) []  [id number] (Orthotic Training) []  [id number] (Prosthetic Training)    Problem List Patient Active Problem List   Diagnosis Date Noted  . Bilateral primary osteoarthritis of knee 08/16/2019  . Mastodynia, female 06/06/2015  . External hemorrhoid 11/30/2012  . Fibroids 11/28/2012  . Vaginal atrophy 11/28/2012  . H/O vitamin D deficiency 11/28/2012  . Osteopenia 11/28/2012  . Menopause 11/28/2012    Oretha Caprice, PT, MPT 06/10/2020, 12:06 PM  [facility] [address] [place], [place], [address] Phone: [phone]   Fax:  [phone]  Name: STAR RESLER MRN: [id number] Date of  Birth: Feb 15, 1940

## 2020-06-10 NOTE — Telephone Encounter (Signed)
Would have her follow up looks like knee pain is improving with PT

## 2020-06-10 NOTE — Telephone Encounter (Signed)
Please see message below. Last in office 03/2020 if you would like to write an appeal let April know.

## 2020-06-11 NOTE — Telephone Encounter (Signed)
Can you call pt and make f/u with Bevely Palmer please for knee pain? Gel injections were denied by insurance

## 2020-06-13 ENCOUNTER — Other Ambulatory Visit: Payer: Self-pay

## 2020-06-13 ENCOUNTER — Ambulatory Visit (INDEPENDENT_AMBULATORY_CARE_PROVIDER_SITE_OTHER): Payer: Medicare PPO | Admitting: Physical Therapy

## 2020-06-13 ENCOUNTER — Ambulatory Visit: Payer: Medicare PPO | Admitting: Orthopedic Surgery

## 2020-06-13 ENCOUNTER — Encounter: Payer: Self-pay | Admitting: Physical Therapy

## 2020-06-13 DIAGNOSIS — M25562 Pain in left knee: Secondary | ICD-10-CM | POA: Diagnosis not present

## 2020-06-13 DIAGNOSIS — G8929 Other chronic pain: Secondary | ICD-10-CM

## 2020-06-13 DIAGNOSIS — M25561 Pain in right knee: Secondary | ICD-10-CM | POA: Diagnosis not present

## 2020-06-13 DIAGNOSIS — R262 Difficulty in walking, not elsewhere classified: Secondary | ICD-10-CM | POA: Diagnosis not present

## 2020-06-13 DIAGNOSIS — R531 Weakness: Secondary | ICD-10-CM

## 2020-06-13 NOTE — Therapy (Signed)
[facility] [address] [place], [place], [address] Phone: [phone]   Fax:  [phone]  Physical Therapy Treatment  Patient Details  Name: Miranda Gonzalez MRN: [id number] Date of Birth: 18-Nov-1940 Referring Provider (PT): Bevely Palmer Persons PA-C   Encounter Date: 06/13/2020   PT End of Session - 06/13/20 1226     Visit Number 11    Number of Visits 13    Date for PT Re-Evaluation 06/21/20    Authorization Type humana    submitted for 4 additional visits from 05/31/2020 to 06/21/2020 on 06/10/2020    PT Start Time 1145    PT Stop Time 1225    PT Time Calculation (min) 40 min    Activity Tolerance Patient tolerated treatment well;Treatment limited secondary to medical complications (Comment)    Behavior During Therapy [facility] for tasks assessed/performed             Past Medical History:  Diagnosis Date   Fibroid uterus    Hypertension    Occipital neuralgia    Osteopenia 10/2018   T score -1.8 FRAX 5.8% / 1.4%    Past Surgical History:  Procedure Laterality Date   CATARACT EXTRACTION     Bilateral   FOOT SURGERY Bilateral    TUBAL LIGATION      There were no vitals filed for this visit.   Subjective Assessment - 06/13/20 1150     Subjective feels stronger, but pain is still present and persistent    Pertinent History osteopenia, HTN, bilateral knee OA, bakers cyst R knee    Patient Stated Goals Walk better, stop hurting    Currently in Pain? Yes    Pain Score 8     Pain Location Knee    Pain Orientation Right;Left    Pain Descriptors / Indicators Aching;Sore    Pain Type Chronic pain    Pain Onset More than a month ago    Pain Frequency Constant    Aggravating Factors  bending, prolonged standing, stairs    Pain Relieving Factors rest, changing positions                               [facility] Adult PT Treatment/Exercise - 06/13/20 1148       Knee/Hip Exercises: Machines for Strengthening   Cybex  Leg Press bil push 81# 3x10; single limb performed bil 31# 3x10      Knee/Hip Exercises: Standing   Knee Flexion Both;3 sets;10 reps    Knee Flexion Limitations 2#    Step Down Limitations attempted step downs for eccentric quad control - unable to flex knee without significant UE support or risk of buckling and fall mainly due to pain with some weakness      Knee/Hip Exercises: Seated   Long Arc Quad Both;10 reps;3 sets    Long Arc Quad Limitations L3 band                      PT Short Term Goals - 06/10/20 1130       PT SHORT TERM GOAL #1   Title Pt will be independent in her initial HEP    Time 3    Status Achieved               PT Long Term Goals - 06/10/20 1131       PT LONG TERM GOAL #1   Title Pt will be able  to perform 5 time sit to stand in </= 20 seconds with no UE support.    Baseline 18 seconds using UE support    Status Achieved      PT LONG TERM GOAL #2   Title Pt will improve R hip flexion to >/= 4+/5 strength to improve functional mobiltiy.    Status On-going      PT LONG TERM GOAL #3   Title Pt will be able to report being able to stand for >/= 30 minutes with pain </= 3/10 in bilateral knees.    Baseline Pt reporting she is able to walk around more than 30 minutes, but is unable to just stand without leaning on something to assist her. Pt repoting 7/10 pain when beginning to move after standing.    Status On-going      PT LONG TERM GOAL #4   Title Pt will improve her FOTO from 38 to >/= 55.    Status On-going                   Plan - 06/13/20 1226     Clinical Impression Statement Pt continues to experience high levels of pain and significant difficulty with eccentric activities.  She has trouble with stability and episodes of buckling causing near falls.  Will continue to benefit from PT to maximize function.  To follow back up with MD to discuss options for additional treatment to help decrease pain and risk of greater  injury.    Personal Factors and Comorbidities Comorbidity 3+    Comorbidities osteopenia, OA, HTN, Baker's cyst right knee    Examination-Activity Limitations Squat;Stairs;Stand;Lift;Transfers    Examination-Participation Restrictions Driving;Community Activity;Other    Stability/Clinical Decision Making Evolving/Moderate complexity    Rehab Potential Fair    PT Frequency 2x / week    PT Duration 6 weeks    PT Treatment/Interventions ADLs/Self Care Home Management;Cryotherapy;Electrical Stimulation;Iontophoresis 4mg /ml Dexamethasone;Moist Heat;Ultrasound;Balance training;Therapeutic exercise;Therapeutic activities;Functional mobility training;Stair training;Gait training;Neuromuscular re-education;Patient/family education;Passive range of motion;Manual techniques;Dry needling;Taping    PT Next Visit Plan Hold off on OKC exercises due to rigth knee popping, eccentric control of quads, stairs/steps    PT Home Exercise Plan Access Code: [email or web address]    Consulted and Agree with Plan of Care Patient             Patient will benefit from skilled therapeutic intervention in order to improve the following deficits and impairments:  Pain, Decreased activity tolerance, Decreased range of motion, Decreased strength, Decreased balance, Difficulty walking, Impaired flexibility  Visit Diagnosis: Chronic pain of right knee  Difficulty in walking, not elsewhere classified  Chronic pain of left knee  Weakness     Problem List Patient Active Problem List   Diagnosis Date Noted   Bilateral primary osteoarthritis of knee 08/16/2019   Mastodynia, female 06/06/2015   External hemorrhoid 11/30/2012   Fibroids 11/28/2012   Vaginal atrophy 11/28/2012   H/O vitamin D deficiency 11/28/2012   Osteopenia 11/28/2012   Menopause 11/28/2012     Laureen Abrahams, PT, DPT 06/13/20 12:29 PM     [facility] [address] [place], [place], [address] Phone:  [phone]   Fax:  [phone]  Name: Miranda Gonzalez MRN: [id number] Date of Birth: [birth year]

## 2020-06-17 ENCOUNTER — Other Ambulatory Visit: Payer: Self-pay

## 2020-06-17 ENCOUNTER — Encounter: Payer: Self-pay | Admitting: Physical Therapy

## 2020-06-17 ENCOUNTER — Ambulatory Visit: Payer: Medicare PPO | Admitting: Physical Therapy

## 2020-06-17 DIAGNOSIS — R262 Difficulty in walking, not elsewhere classified: Secondary | ICD-10-CM

## 2020-06-17 DIAGNOSIS — M25562 Pain in left knee: Secondary | ICD-10-CM

## 2020-06-17 DIAGNOSIS — R531 Weakness: Secondary | ICD-10-CM

## 2020-06-17 DIAGNOSIS — G8929 Other chronic pain: Secondary | ICD-10-CM

## 2020-06-17 DIAGNOSIS — M25561 Pain in right knee: Secondary | ICD-10-CM

## 2020-06-17 NOTE — Therapy (Signed)
[facility] [address] [place], [place], [address] Phone: [phone]   Fax:  [phone]  Physical Therapy Treatment  Patient Details  Name: Miranda Gonzalez MRN: [id number] Date of Birth: 26-Dec-1940 Referring Provider (PT): Bevely Palmer Persons PA-C   Encounter Date: 06/17/2020   PT End of Session - 06/17/20 1124     Visit Number 12    Number of Visits 13    Date for PT Re-Evaluation 06/21/20    Authorization Type humana    submitted for 4 additional visits from 05/31/2020 to 06/21/2020 on 06/10/2020    PT Start Time 1105    PT Stop Time 1145    PT Time Calculation (min) 40 min    Activity Tolerance Patient tolerated treatment well;Treatment limited secondary to medical complications (Comment)    Behavior During Therapy [facility] for tasks assessed/performed             Past Medical History:  Diagnosis Date   Fibroid uterus    Hypertension    Occipital neuralgia    Osteopenia 10/2018   T score -1.8 FRAX 5.8% / 1.4%    Past Surgical History:  Procedure Laterality Date   CATARACT EXTRACTION     Bilateral   FOOT SURGERY Bilateral    TUBAL LIGATION      There were no vitals filed for this visit.   Subjective Assessment - 06/17/20 1122     Subjective Pt arriving today reporting 9/10 pain in left knee and still reporting experiencing knee buckeling at home while performing ADL's.    Pertinent History osteopenia, HTN, bilateral knee OA, bakers cyst R knee    Patient Stated Goals Walk better, stop hurting    Currently in Pain? Yes    Pain Score 9     Pain Orientation Left;Right   Left knee 9/10, right knee 5/10.   Pain Descriptors / Indicators Aching;Tightness;Sore    Pain Type Chronic pain    Pain Onset More than a month ago                [facility] PT Assessment - 06/17/20 0001       Assessment   Medical Diagnosis M17.0 Primary OA bilateral knees    Referring Provider (PT) Bevely Palmer Persons PA-C      AROM   AROM  Assessment Site Knee    Right Knee Extension 4    Right Knee Flexion 120    Left Knee Extension 4    Left Knee Flexion 125      Palpation   Palpation comment pt with increased pain/tenderness over left lateral knee                           [facility] Adult PT Treatment/Exercise - 06/17/20 0001       Knee/Hip Exercises: Aerobic   Recumbent Bike L4 x 6 minutes at end of session      Knee/Hip Exercises: Machines for Strengthening   Cybex Leg Press bil push 81 # 2x10; single limb performed bil 31# 2x10      Knee/Hip Exercises: Standing   Knee Flexion Both;3 sets;10 reps    Knee Flexion Limitations 2#      Knee/Hip Exercises: Seated   Other Seated Knee/Hip Exercises seated SLR 2x10 each LE    Sit to Sand 10 reps                      PT Short  Term Goals - 06/17/20 1132       PT SHORT TERM GOAL #1   Title Pt will be independent in her initial HEP    Status Achieved               PT Long Term Goals - 06/17/20 1141       PT LONG TERM GOAL #1   Title Pt will be able to perform 5 time sit to stand in </= 20 seconds with no UE support.    Baseline 18 seconds using UE support    Status Achieved      PT LONG TERM GOAL #2   Title Pt will improve R hip flexion to >/= 4+/5 strength to improve functional mobiltiy.    Status On-going      PT LONG TERM GOAL #3   Title Pt will be able to report being able to stand for >/= 30 minutes with pain </= 3/10 in bilateral knees.    Baseline Pt reporting she is able to walk around more than 30 minutes, but is unable to just stand without leaning on something to assist her. Pt repoting 7/10 pain when beginning to move after standing.    Status On-going      PT LONG TERM GOAL #4   Title Pt will improve her FOTO from 38 to >/= 55.    Status On-going                   Plan - 06/17/20 1125     Clinical Impression Statement Pt continues to report episodes of her knees buckeling. Pt still reporting  "popping sensation" with open kinetic chain activities. Pt has made progress with PT and has gained strength but still presenting with weakness in bilateral quads and hamstrings. Pt currently is a high risk of falls due to imtermittent knee buckling.  I strongly recommended pt to follow up with appointment with Dr. Sharol Given to assess further treatment options for pt to help with pain and overall function.    Personal Factors and Comorbidities Comorbidity 3+    Comorbidities osteopenia, OA, HTN, Baker's cyst right knee    Examination-Activity Limitations Squat;Stairs;Stand;Lift;Transfers    Examination-Participation Restrictions Driving;Community Activity;Other    Stability/Clinical Decision Making Evolving/Moderate complexity    Rehab Potential Fair    PT Frequency 2x / week    PT Duration 6 weeks    PT Treatment/Interventions ADLs/Self Care Home Management;Cryotherapy;Electrical Stimulation;Iontophoresis 4mg /ml Dexamethasone;Moist Heat;Ultrasound;Balance training;Therapeutic exercise;Therapeutic activities;Functional mobility training;Stair training;Gait training;Neuromuscular re-education;Patient/family education;Passive range of motion;Manual techniques;Dry needling;Taping    PT Next Visit Plan Pt has appointment with Dr. Sharol Given on 06/18/2020, Hold off on OKC exercises due to rigth knee popping, eccentric control of quads, stairs/steps    PT Home Exercise Plan Access Code: [email or web address]    Consulted and Agree with Plan of Care Patient             Patient will benefit from skilled therapeutic intervention in order to improve the following deficits and impairments:  Pain, Decreased activity tolerance, Decreased range of motion, Decreased strength, Decreased balance, Difficulty walking, Impaired flexibility  Visit Diagnosis: Chronic pain of right knee  Difficulty in walking, not elsewhere classified  Chronic pain of left knee  Weakness     Problem List Patient Active Problem List   Diagnosis  Date Noted   Bilateral primary osteoarthritis of knee 08/16/2019   Mastodynia, female 06/06/2015   External hemorrhoid 11/30/2012   Fibroids 11/28/2012   Vaginal atrophy 11/28/2012  H/O vitamin D deficiency 11/28/2012   Osteopenia 11/28/2012   Menopause 11/28/2012    Oretha Caprice, PT, MPT 06/17/2020, 11:48 AM  [facility] [address] [place], [place], [address] Phone: [phone]   Fax:  [phone]  Name: Miranda Gonzalez MRN: [id number] Date of Birth: 08-28-1940

## 2020-06-18 ENCOUNTER — Ambulatory Visit (INDEPENDENT_AMBULATORY_CARE_PROVIDER_SITE_OTHER): Payer: Medicare PPO | Admitting: Orthopedic Surgery

## 2020-06-18 DIAGNOSIS — M170 Bilateral primary osteoarthritis of knee: Secondary | ICD-10-CM

## 2020-06-19 ENCOUNTER — Encounter: Payer: Self-pay | Admitting: Orthopedic Surgery

## 2020-06-19 NOTE — Progress Notes (Signed)
Office Visit Note   Patient: Miranda Gonzalez           Date of Birth: [birth year]           MRN: [id number] Visit Date: 06/18/2020              Requested by: Dixie Dials, MD [address] [place],   [address] PCP: Dixie Dials, MD  Chief Complaint  Patient presents with   Right Knee - Pain   Left Knee - Pain      HPI: Patient is a 80 year old woman who is seen in follow-up for osteoarthritis bilateral knees.  She did have a Monovisc injection in both knees about a year ago and this did provide her good relief with her arthritis.  Patient has undergone physical therapy 2 times a week for 6 weeks and still has 2 additional physical therapy is left to complete.  She has used assistive devices including a cane.  She has taken oral anti-inflammatories without relief including Advil and she has had temporary relief with previous steroid injections.  Patient states that this time she has increased pain and stiffness and decreasing range of motion with the arthritis in her knees she states she has episodes of giving way and is concerned with possible falling.  She does have pain with activities of daily living.  Assessment & Plan: Visit Diagnoses:  1. Bilateral primary osteoarthritis of knee     Plan: We will request a appeal for the denial for her hyaluronic acid injections.  I feel that if this is not approved her next option would be to proceed with total knee arthroplasty.  Patient has undergone prolonged conservative therapy including steroid injections physical therapy activity modification and assistive devices.  Follow-Up Instructions: Return if symptoms worsen or fail to improve, for Follow-up once.  Hyaluronic acid injection.  Proved.Manson Passey Exam  Patient is alert, oriented, no adenopathy, well-dressed, normal affect, normal respiratory effort. Examination patient has crepitation and pain with range of motion of her knee she has range of motion from 5 to 100  degrees.  She has tenderness to palpation the patellofemoral joint as well as medial lateral joint line.  Collaterals and cruciates are stable.  Imaging: No results found. No images are attached to the encounter.  Labs: No results found for: HGBA1C, ESRSEDRATE, CRP, LABURIC, REPTSTATUS, GRAMSTAIN, CULT, LABORGA   No results found for: ALBUMIN, PREALBUMIN, CBC  No results found for: MG No results found for: VD25OH  No results found for: PREALBUMIN No flowsheet data found.   There is no height or weight on file to calculate BMI.  Orders:  No orders of the defined types were placed in this encounter.  No orders of the defined types were placed in this encounter.    Procedures: No procedures performed  Clinical Data: No additional findings.  ROS:  All other systems negative, except as noted in the HPI. Review of Systems  Objective: Vital Signs: There were no vitals taken for this visit.  Specialty Comments:  No specialty comments available.  PMFS History: Patient Active Problem List   Diagnosis Date Noted   Bilateral primary osteoarthritis of knee 08/16/2019   Mastodynia, female 06/06/2015   External hemorrhoid 11/30/2012   Fibroids 11/28/2012   Vaginal atrophy 11/28/2012   H/O vitamin D deficiency 11/28/2012   Osteopenia 11/28/2012   Menopause 11/28/2012   Past Medical History:  Diagnosis Date   Fibroid uterus    Hypertension  Occipital neuralgia    Osteopenia 10/2018   T score -1.8 FRAX 5.8% / 1.4%    Family History  Problem Relation Age of Onset   Heart disease Mother    Hypertension Mother    Diabetes Father    Heart disease Father    Diabetes Sister    Heart disease Sister    Hypertension Sister    Breast cancer Sister 46   Cancer Sister        Lung   Diabetes Brother    Thyroid disease Daughter    Migraines Daughter    Cancer Maternal Grandmother        breast     Past Surgical History:  Procedure Laterality Date   CATARACT  EXTRACTION     Bilateral   FOOT SURGERY Bilateral    TUBAL LIGATION     Social History   Occupational History   Not on file  Tobacco Use   Smoking status: Former    Pack years: 0.00   Smokeless tobacco: Never  Vaping Use   Vaping Use: Never used  Substance and Sexual Activity   Alcohol use: Yes    Alcohol/week: 7.0 standard drinks    Types: 7 Glasses of wine per week   Drug use: No   Sexual activity: Not Currently    Partners: Male    Comment: 1st intercourse 80 yo-More than 5 partners

## 2020-06-20 ENCOUNTER — Other Ambulatory Visit: Payer: Self-pay

## 2020-06-20 ENCOUNTER — Ambulatory Visit (INDEPENDENT_AMBULATORY_CARE_PROVIDER_SITE_OTHER): Payer: Medicare PPO | Admitting: Physical Therapy

## 2020-06-20 ENCOUNTER — Telehealth: Payer: Self-pay

## 2020-06-20 DIAGNOSIS — R531 Weakness: Secondary | ICD-10-CM

## 2020-06-20 DIAGNOSIS — M25562 Pain in left knee: Secondary | ICD-10-CM

## 2020-06-20 DIAGNOSIS — M25561 Pain in right knee: Secondary | ICD-10-CM | POA: Diagnosis not present

## 2020-06-20 DIAGNOSIS — G8929 Other chronic pain: Secondary | ICD-10-CM

## 2020-06-20 DIAGNOSIS — R262 Difficulty in walking, not elsewhere classified: Secondary | ICD-10-CM

## 2020-06-20 NOTE — Therapy (Addendum)
[facility] Physical Therapy [address] [place], [place], [address] Phone: [phone]   Fax:  [phone]  Physical Therapy Treatment Discharge  Patient Details  Name: Miranda Gonzalez MRN: [id number] Date of Birth: [birth year] Referring Provider (PT): Bevely Palmer Persons PA-C   Encounter Date: 06/20/2020   PT End of Session - 06/20/20 1512     Visit Number 13    Number of Visits 13    Date for PT Re-Evaluation 06/21/20    Authorization Type humana    submitted for 4 additional visits from 05/31/2020 to 06/21/2020 on 06/10/2020    PT Start Time 1433    PT Stop Time 1512    PT Time Calculation (min) 39 min    Activity Tolerance Patient tolerated treatment well;Treatment limited secondary to medical complications (Comment)    Behavior During Therapy [facility] for tasks assessed/performed             Past Medical History:  Diagnosis Date   Fibroid uterus    Hypertension    Occipital neuralgia    Osteopenia 10/2018   T score -1.8 FRAX 5.8% / 1.4%    Past Surgical History:  Procedure Laterality Date   CATARACT EXTRACTION     Bilateral   FOOT SURGERY Bilateral    TUBAL LIGATION      There were no vitals filed for this visit.   Subjective Assessment - 06/20/20 1435     Subjective doing okay today; Rt knee feels weak, and Lt kept her up last night    Pertinent History osteopenia, HTN, bilateral knee OA, bakers cyst R knee    How long can you stand comfortably? 5 min - knee buckles    How long can you walk comfortably? up to 30 min; pain elevated    Patient Stated Goals Walk better, stop hurting    Currently in Pain? Yes    Pain Score 7     Pain Location Knee    Pain Orientation Right;Left    Pain Descriptors / Indicators Aching;Tightness;Sore    Pain Type Chronic pain    Pain Onset More than a month ago    Pain Frequency Constant    Aggravating Factors  bending, prolonged standing, stairs    Pain Relieving Factors rest, changing positions                 [facility] PT Assessment - 06/20/20 1448       Assessment   Medical Diagnosis M17.0 Primary OA bilateral knees    Referring Provider (PT) Bevely Palmer Persons PA-C      Observation/Other Assessments   Focus on Therapeutic Outcomes (FOTO)  41      Strength   Right Hip Flexion 4+/5    Right Knee Flexion 4+/5    Right Knee Extension 4+/5    Left Knee Flexion 5/5    Left Knee Extension 4+/5                           [facility] Adult PT Treatment/Exercise - 06/20/20 1435       Knee/Hip Exercises: Stretches   Gastroc Stretch 3 reps;30 seconds    Gastroc Stretch Limitations slant board      Knee/Hip Exercises: Aerobic   Recumbent Bike L4 x 5 min      Knee/Hip Exercises: Machines for Strengthening   Cybex Knee Extension 5# x 20 reps    Cybex Leg Press bil push 81 # 2x10; single limb performed  bil 31# 2x10                      PT Short Term Goals - 06/17/20 1132       PT SHORT TERM GOAL #1   Title Pt will be independent in her initial HEP    Status Achieved               PT Long Term Goals - 06/20/20 1512       PT LONG TERM GOAL #1   Title Pt will be able to perform 5 time sit to stand in </= 20 seconds with no UE support.    Baseline 18 seconds using UE support    Status Achieved      PT LONG TERM GOAL #2   Title Pt will improve R hip flexion to >/= 4+/5 strength to improve functional mobiltiy.    Status Achieved      PT LONG TERM GOAL #3   Title Pt will be able to report being able to stand for >/= 30 minutes with pain </= 3/10 in bilateral knees.    Status Not Met      PT LONG TERM GOAL #4   Title Pt will improve her FOTO from 38 to >/= 55.    Status Not Met                   Plan - 06/20/20 1513     Clinical Impression Statement Pt has met 2 LTGs and did not meet other LTGs.  She continues to report elevated pain which is impacting her function and quality of life.  Plan to hold PT for now and recommend return  to PT PRN.    Personal Factors and Comorbidities Comorbidity 3+    Comorbidities osteopenia, OA, HTN, Baker's cyst right knee    Examination-Activity Limitations Squat;Stairs;Stand;Lift;Transfers    Examination-Participation Restrictions Driving;Community Activity;Other    Stability/Clinical Decision Making Evolving/Moderate complexity    Rehab Potential Fair    PT Frequency 2x / week    PT Duration 6 weeks    PT Treatment/Interventions ADLs/Self Care Home Management;Cryotherapy;Electrical Stimulation;Iontophoresis 86m/ml Dexamethasone;Moist Heat;Ultrasound;Balance training;Therapeutic exercise;Therapeutic activities;Functional mobility training;Stair training;Gait training;Neuromuscular re-education;Patient/family education;Passive range of motion;Manual techniques;Dry needling;Taping    PT Next Visit Plan hold PT x 30 days, await appeal for injection    PT Home Exercise Plan Access Code: [email or web address]   Consulted and Agree with Plan of Care Patient             Patient will benefit from skilled therapeutic intervention in order to improve the following deficits and impairments:  Pain, Decreased activity tolerance, Decreased range of motion, Decreased strength, Decreased balance, Difficulty walking, Impaired flexibility  Visit Diagnosis: Chronic pain of right knee  Chronic pain of left knee  Difficulty in walking, not elsewhere classified  Weakness  PHYSICAL THERAPY DISCHARGE SUMMARY  Visits from Start of Care: 13  Current functional level related to goals / functional outcomes: See aboe   Remaining deficits: See above   Education / Equipment: HEP   Patient agrees to discharge. Patient goals were partially met. Patient is being discharged due to the patient's request.     Problem List Patient Active Problem List   Diagnosis Date Noted   Bilateral primary osteoarthritis of knee 08/16/2019   Mastodynia, female 06/06/2015   External hemorrhoid 11/30/2012   Fibroids  11/28/2012   Vaginal atrophy 11/28/2012   H/O vitamin D deficiency 11/28/2012   Osteopenia  11/28/2012   Menopause 11/28/2012    Miranda Gonzalez 06/20/2020, 3:15 PM  [facility] Physical Therapy [address] [place], [place], [address] Phone: [phone]   Fax:  [phone]  Name: Miranda Gonzalez MRN: [id number] Date of Birth: 03-08-1940  Kearney Hard, PT, MPT 08/13/20 4:11 PM

## 2020-06-20 NOTE — Telephone Encounter (Signed)
Faxed Appeal letter and note to [employer].at [phone].

## 2020-07-24 ENCOUNTER — Telehealth: Payer: Self-pay | Admitting: Orthopedic Surgery

## 2020-07-24 NOTE — Telephone Encounter (Signed)
Patient called stating she was approved for the gel injection. Patient asked for a call back when approval is showing in the system. The number to contact patient is [phone]

## 2020-07-25 NOTE — Telephone Encounter (Signed)
Talked with patient and she will bring approval letter to her appointment on 08/01/2020.

## 2020-07-31 ENCOUNTER — Ambulatory Visit (INDEPENDENT_AMBULATORY_CARE_PROVIDER_SITE_OTHER): Payer: Medicare PPO | Admitting: Family

## 2020-07-31 DIAGNOSIS — M170 Bilateral primary osteoarthritis of knee: Secondary | ICD-10-CM | POA: Diagnosis not present

## 2020-07-31 MED ORDER — LIDOCAINE HCL 1 % IJ SOLN
1.0000 mL | INTRAMUSCULAR | Status: AC | PRN
  Administered 2020-07-31: 1 mL

## 2020-07-31 MED ORDER — HYALURONAN 88 MG/4ML IX SOSY
88.0000 mg | PREFILLED_SYRINGE | INTRA_ARTICULAR | Status: AC | PRN
  Administered 2020-07-31: 88 mg via INTRA_ARTICULAR

## 2020-07-31 NOTE — Progress Notes (Signed)
Office Visit Note   Patient: Miranda Gonzalez           Date of Birth: Jun 09, 1940           MRN: [id number] Visit Date: 07/31/2020              Requested by: Dixie Dials, MD [address] [place],  [place] [address] PCP: Dixie Dials, MD  Chief Complaint  Patient presents with   Right Knee - Follow-up    Buy and bill bilateral monovisc injections   Left Knee - Follow-up      HPI: Is a 80 year old woman who presents today for planned Monovisc injection bilateral knees.  She last had supplemental injection about a year ago this worked well for her  Assessment & Plan: Visit Diagnoses: No diagnosis found.  Plan: Monovisc injection bilateral knees patient tolerated well we will follow-up with her as needed  Follow-Up Instructions: No follow-ups on file.   Ortho Exam  Patient is alert, oriented, no adenopathy, well-dressed, normal affect, normal respiratory effort.   Imaging: No results found. No images are attached to the encounter.  Labs: No results found for: HGBA1C, ESRSEDRATE, CRP, LABURIC, REPTSTATUS, GRAMSTAIN, CULT, LABORGA   No results found for: ALBUMIN, PREALBUMIN, CBC  No results found for: MG No results found for: VD25OH  No results found for: PREALBUMIN No flowsheet data found.   There is no height or weight on file to calculate BMI.  Orders:  No orders of the defined types were placed in this encounter.  No orders of the defined types were placed in this encounter.    Procedures: Large Joint Inj: bilateral knee on 07/31/2020 4:08 PM Indications: pain Details: 18 G 1.5 in needle, anteromedial approach Medications (Right): 1 mL lidocaine 1 %; 88 mg Hyaluronan 88 MG/4ML Medications (Left): 1 mL lidocaine 1 %; 88 mg Hyaluronan 88 MG/4ML Consent was given by the patient.     Clinical Data: No additional findings.  ROS:  All other systems negative, except as noted in the HPI. Review of Systems  Musculoskeletal:  Positive for  arthralgias, joint swelling and myalgias.   Objective: Vital Signs: There were no vitals taken for this visit.  Specialty Comments:  No specialty comments available.  PMFS History: Patient Active Problem List   Diagnosis Date Noted   Bilateral primary osteoarthritis of knee 08/16/2019   Mastodynia, female 06/06/2015   External hemorrhoid 11/30/2012   Fibroids 11/28/2012   Vaginal atrophy 11/28/2012   H/O vitamin D deficiency 11/28/2012   Osteopenia 11/28/2012   Menopause 11/28/2012   Past Medical History:  Diagnosis Date   Fibroid uterus    Hypertension    Occipital neuralgia    Osteopenia 10/2018   T score -1.8 FRAX 5.8% / 1.4%    Family History  Problem Relation Age of Onset   Heart disease Mother    Hypertension Mother    Diabetes Father    Heart disease Father    Diabetes Sister    Heart disease Sister    Hypertension Sister    Breast cancer Sister 54   Cancer Sister        Lung   Diabetes Brother    Thyroid disease Daughter    Migraines Daughter    Cancer Maternal Grandmother        breast     Past Surgical History:  Procedure Laterality Date   CATARACT EXTRACTION     Bilateral   FOOT SURGERY Bilateral  TUBAL LIGATION     Social History   Occupational History   Not on file  Tobacco Use   Smoking status: Former   Smokeless tobacco: Never  Vaping Use   Vaping Use: Never used  Substance and Sexual Activity   Alcohol use: Yes    Alcohol/week: 7.0 standard drinks    Types: 7 Glasses of wine per week   Drug use: No   Sexual activity: Not Currently    Partners: Male    Comment: 1st intercourse 80 yo-More than 5 partners

## 2020-08-01 ENCOUNTER — Ambulatory Visit: Payer: Medicare PPO | Admitting: Orthopedic Surgery

## 2020-08-19 ENCOUNTER — Ambulatory Visit (INDEPENDENT_AMBULATORY_CARE_PROVIDER_SITE_OTHER): Payer: Medicare PPO | Admitting: Nurse Practitioner

## 2020-08-19 ENCOUNTER — Other Ambulatory Visit: Payer: Self-pay

## 2020-08-19 ENCOUNTER — Encounter: Payer: Self-pay | Admitting: Nurse Practitioner

## 2020-08-19 VITALS — BP 122/78 | Ht 61.0 in | Wt 172.0 lb

## 2020-08-19 DIAGNOSIS — Z780 Asymptomatic menopausal state: Secondary | ICD-10-CM

## 2020-08-19 DIAGNOSIS — Z01419 Encounter for gynecological examination (general) (routine) without abnormal findings: Secondary | ICD-10-CM

## 2020-08-19 DIAGNOSIS — M85851 Other specified disorders of bone density and structure, right thigh: Secondary | ICD-10-CM

## 2020-08-19 DIAGNOSIS — Z9189 Other specified personal risk factors, not elsewhere classified: Secondary | ICD-10-CM

## 2020-08-19 NOTE — Progress Notes (Signed)
   ANNALIZA WILLIG 1940-10-22 [id number]   History:  80 y.o. [id number] presents for breast and pelvic exam. No GYN complaints. Postmenopausal - no HRT, no bleeding. Normal pap and mammogram history. History of uterine fibroids - U/S 06/2019 showed stability, asymptomatic. History of lichen sclerosus, has not had issues in years. Complains of mild intermittent left breast pain that is not new for her and has had negative workup.   Gynecologic History No LMP recorded. Patient is postmenopausal.   Contraception: post menopausal status  Health Maintenance Last Pap: No longer screening per guidelines Last mammogram: 03/29/2020. Results were: Normal Last colonoscopy: 2020 Last Dexa: 10/2018. Results were: T-score -1.8, FRAX 5.8% / 1.4%  Past medical history, past surgical history, family history and social history were all reviewed and documented in the EPIC chart.  ROS:  A ROS was performed and pertinent positives and negatives are included.  Exam:  Vitals:   08/19/20 1409  BP: 122/78  Weight: 172 lb (78 kg)  Height: '5\' 1"'$  (1.549 m)   Body mass index is 32.5 kg/m.  General appearance:  Normal Thyroid:  Symmetrical, normal in size, without palpable masses or nodularity. Respiratory  Auscultation:  Clear without wheezing or rhonchi Cardiovascular  Auscultation:  Regular rate, without rubs, murmurs or gallops  Edema/varicosities:  Not grossly evident Abdominal  Soft,nontender, without masses, guarding or rebound.  Liver/spleen:  No organomegaly noted  Hernia:  None appreciated  Skin  Inspection:  Grossly normal Breasts: Examined lying and sitting.   Right: Without masses, retractions, nipple discharge or axillary adenopathy.   Left: Without masses, retractions, nipple discharge or axillary adenopathy. Mild tenderness with palpation along central breast.  Genitourinary   Inguinal/mons:  Normal without inguinal adenopathy  External genitalia:  Normal appearing vulva with no  masses, tenderness, or lesions  BUS/Urethra/Skene's glands:  Normal  Vagina:  Normal appearing with normal color and discharge, no lesions. Atrophic changes  Cervix:  Normal appearing without discharge or lesions  Uterus:  Slightly enlarged, 8 week fibroid uterus, nontender  Adnexa/parametria:     Rt: Normal in size, without masses or tenderness.   Lt: Normal in size, without masses or tenderness.  Anus and perineum: Normal  Digital rectal exam:Declines  Patient informed chaperone available to be present for breast and pelvic exam. Patient has requested no chaperone to be present. Patient has been advised what will be completed during breast and pelvic exam.   Assessment/Plan:  80 y.o. [phone] for breast and pelvic exam.   Well female exam with routine gynecological exam - Education provided on SBEs, importance of preventative screenings, current guidelines, high calcium diet, regular exercise, and multivitamin daily. Labs with PCP.   Postmenopausal - no HRT, no bleeding.   Osteopenia of neck of right femur - T-score -1.8 without elevated FRAX. Continue Vitamin D/calcium supplement and regular exercise. Recommend repeating Dexa this October.  Screening for cervical cancer - Normal Pap history. No longer screening per guidelines.   Screening for breast cancer - Normal mammogram history.  Continue annual screenings.  Normal breast exam today.  Screening for colon cancer - 2020 colonoscopy. Will repeat at GI's recommended interval.   Return in 2 years for breast and pelvic exam.   Tamela Gammon DNP, 2:47 PM 08/19/2020

## 2021-02-25 ENCOUNTER — Other Ambulatory Visit: Payer: Self-pay

## 2021-02-25 ENCOUNTER — Ambulatory Visit (INDEPENDENT_AMBULATORY_CARE_PROVIDER_SITE_OTHER): Payer: Medicare PPO | Admitting: Orthopedic Surgery

## 2021-02-25 DIAGNOSIS — M170 Bilateral primary osteoarthritis of knee: Secondary | ICD-10-CM

## 2021-03-06 ENCOUNTER — Telehealth: Payer: Self-pay

## 2021-03-06 NOTE — Telephone Encounter (Signed)
-----   Message from Pamella Pert, [place] sent at 02/25/2021  3:48 PM EST ----- ?Regarding: gel injections ?Please order bilateral gel injections for this pt bilat knee OA. Had them last August would like again. Thanks! ? ?

## 2021-03-06 NOTE — Telephone Encounter (Signed)
Noted. ? ?VOB submitted for Monovisc, bilateral knee. ?BV pending ?

## 2021-03-10 ENCOUNTER — Telehealth: Payer: Self-pay

## 2021-03-10 ENCOUNTER — Encounter: Payer: Self-pay | Admitting: Orthopedic Surgery

## 2021-03-10 NOTE — Progress Notes (Signed)
Office Visit Note   Patient: Miranda Gonzalez           Date of Birth: 18-Mar-1940           MRN: [id number] Visit Date: 02/25/2021              Requested by: Dixie Dials, MD [address] [place],  [place] [address] PCP: Dixie Dials, MD  Chief Complaint  Patient presents with   Right Ankle - Pain      HPI: Patient is a 81 year old woman who presents complaining of bilateral knee arthritic pain as well as right ankle pain and swelling.  Patient states her last hyaluronic acid injection was in August 2022.  Patient states she has been having swelling in her lower extremities she states her cardiologist echocardiogram was normal.  Patient states she has deep pain in the ankle and arch.  Patient has had a history of a Baker's cyst in the right knee and she has intermittent mechanical symptoms in the right knee as well as inflammation in the left knee.  Assessment & Plan: Visit Diagnoses:  1. Bilateral primary osteoarthritis of knee     Plan: Will request authorization for hyaluronic acid injection for both knees.  She is over 6 months out from her last injection.  Follow-Up Instructions: Return if symptoms worsen or fail to improve.   Ortho Exam  Patient is alert, oriented, no adenopathy, well-dressed, normal affect, normal respiratory effort. Examination patient has an effusion and a palpable Baker's cyst in the popliteal fossa of both knees.  Collaterals and cruciates are stable radiographs show severe osteoarthritis of both knees.  Patient has palpable pulses in both ankles.  Examination of the lower extremities patient has venous stasis insufficiency bilaterally with pitting edema in both lower extremities.  On the left foot she has an iatrogenic hallux varus deformity secondary to previous bunion surgery.  Examination of the right ankle she is globally tender to palpation however motor strength is good in both peroneal posterior tibial tendon plantarflexion and  dorsiflexion.  Imaging: No results found. No images are attached to the encounter.  Labs: No results found for: HGBA1C, ESRSEDRATE, CRP, LABURIC, REPTSTATUS, GRAMSTAIN, CULT, LABORGA   No results found for: ALBUMIN, PREALBUMIN, CBC  No results found for: MG No results found for: VD25OH  No results found for: PREALBUMIN No flowsheet data found.   There is no height or weight on file to calculate BMI.  Orders:  No orders of the defined types were placed in this encounter.  No orders of the defined types were placed in this encounter.    Procedures: No procedures performed  Clinical Data: No additional findings.  ROS:  All other systems negative, except as noted in the HPI. Review of Systems  Objective: Vital Signs: There were no vitals taken for this visit.  Specialty Comments:  No specialty comments available.  PMFS History: Patient Active Problem List   Diagnosis Date Noted   Bilateral primary osteoarthritis of knee 08/16/2019   Mastodynia, female 06/06/2015   External hemorrhoid 11/30/2012   Fibroids 11/28/2012   Vaginal atrophy 11/28/2012   H/O vitamin D deficiency 11/28/2012   Osteopenia 11/28/2012   Menopause 11/28/2012   Past Medical History:  Diagnosis Date   Arthritis    Fibroid uterus    Hypertension    Occipital neuralgia    Osteopenia 10/2018   T score -1.8 FRAX 5.8% / 1.4%    Family History  Problem Relation Age of  Onset   Heart disease Mother    Hypertension Mother    Diabetes Father    Heart disease Father    Diabetes Sister    Heart disease Sister    Hypertension Sister    Breast cancer Sister 34   Cancer Sister        Lung   Diabetes Brother    Thyroid disease Daughter    Migraines Daughter    Cancer Maternal Grandmother        breast     Past Surgical History:  Procedure Laterality Date   CATARACT EXTRACTION     Bilateral   FOOT SURGERY Bilateral    TUBAL LIGATION     Social History   Occupational History    Not on file  Tobacco Use   Smoking status: Former   Smokeless tobacco: Never  Vaping Use   Vaping Use: Never used  Substance and Sexual Activity   Alcohol use: Yes    Alcohol/week: 7.0 standard drinks    Types: 7 Glasses of wine per week   Drug use: No   Sexual activity: Not Currently    Partners: Male    Comment: 1st intercourse 81 yo-More than 5 partners

## 2021-03-10 NOTE — Telephone Encounter (Signed)
Approved for Monovisc, bilateral knee. ?[place] ?Covered at 100% once OOP has been met. ?Co-pay of $40.00 ?No PA required ? ?Appt. 03/14/2021 with Junie Panning ?

## 2021-03-12 ENCOUNTER — Encounter: Payer: Self-pay | Admitting: Family

## 2021-03-12 ENCOUNTER — Ambulatory Visit: Payer: Medicare PPO | Admitting: Family

## 2021-03-12 DIAGNOSIS — M170 Bilateral primary osteoarthritis of knee: Secondary | ICD-10-CM | POA: Diagnosis not present

## 2021-03-12 MED ORDER — LIDOCAINE HCL 1 % IJ SOLN
1.0000 mL | INTRAMUSCULAR | Status: AC | PRN
  Administered 2021-03-12: 1 mL

## 2021-03-12 MED ORDER — HYALURONAN 88 MG/4ML IX SOSY
88.0000 mg | PREFILLED_SYRINGE | INTRA_ARTICULAR | Status: AC | PRN
  Administered 2021-03-12: 88 mg via INTRA_ARTICULAR

## 2021-03-12 NOTE — Progress Notes (Signed)
? ?Office Visit Note ?  ?Patient: Miranda Gonzalez           ?Date of Birth: [birth year]           ?MRN: [id number] ?Visit Date: 03/12/2021 ?             ?Requested by: Dixie Dials, MD ?[address] ?[address] ?[place],  [place] [address] ?PCP: Dixie Dials, MD ? ?Chief Complaint  ?Patient presents with  ? Left Knee - Follow-up  ?  Monovisc injection  ? Right Knee - Follow-up  ?  Monovisc injection  ? ? ? ? ?HPI: ?Patient is a 81 year old woman who presents complaining of bilateral knee arthritic pain.  Patient states her last hyaluronic acid injection was in august 2022. This gave good interval relief.  ? ?Patient has had a history of a Baker's cyst in the right knee and she has intermittent mechanical symptoms in the right knee as well as inflammation in the left knee. She is complaining of new catching pain on left, this is along lateral knee and patella. ? ?Assessment & Plan: ?Visit Diagnoses:  ?1. Bilateral primary osteoarthritis of knee   ? ? ?Plan: given hyaluronic acid injection for both knees. Tolerated well. She is 7 months out from her last injection. ? ?Follow-Up Instructions: Return if symptoms worsen or fail to improve.  ? ?Ortho Exam ? ?Patient is alert, oriented, no adenopathy, well-dressed, normal affect, normal respiratory effort. ?Examination patient has an effusion and a palpable Baker's cyst in the popliteal fossa of both knees.  Collaterals and cruciates are stable radiographs show severe osteoarthritis of both knees.   ? ?Imaging: ?No results found. ?No images are attached to the encounter. ? ?Labs: ?No results found for: HGBA1C, ESRSEDRATE, CRP, LABURIC, REPTSTATUS, GRAMSTAIN, CULT, LABORGA ? ? ?No results found for: ALBUMIN, PREALBUMIN, CBC ? ?No results found for: MG ?No results found for: VD25OH ? ?No results found for: PREALBUMIN ?No flowsheet data found. ? ? ?There is no height or weight on file to calculate BMI. ? ?Orders:  ?No orders of the defined types were placed in this encounter. ? ?No  orders of the defined types were placed in this encounter. ? ? ? Procedures: ?Large Joint Inj: bilateral knee on 03/12/2021 2:23 PM ?Indications: pain ?Details: 18 G 1.5 in needle, anteromedial approach ?Medications (Right): 1 mL lidocaine 1 %; 88 mg Hyaluronan 88 MG/4ML ?Medications (Left): 1 mL lidocaine 1 %; 88 mg Hyaluronan 88 MG/4ML ?Consent was given by the patient.  ? ? ? ?Clinical Data: ?No additional findings. ? ?ROS: ? ?All other systems negative, except as noted in the HPI. ?Review of Systems  ?Constitutional:  Negative for chills and fever.  ?Cardiovascular:  Negative for leg swelling.  ?Musculoskeletal:  Positive for arthralgias.  ? ?Objective: ?Vital Signs: There were no vitals taken for this visit. ? ?Specialty Comments:  ?No specialty comments available. ? ?PMFS History: ?Patient Active Problem List  ? Diagnosis Date Noted  ? Bilateral primary osteoarthritis of knee 08/16/2019  ? Mastodynia, female 06/06/2015  ? External hemorrhoid 11/30/2012  ? Fibroids 11/28/2012  ? Vaginal atrophy 11/28/2012  ? H/O vitamin D deficiency 11/28/2012  ? Osteopenia 11/28/2012  ? Menopause 11/28/2012  ? ?Past Medical History:  ?Diagnosis Date  ? Arthritis   ? Fibroid uterus   ? Hypertension   ? Occipital neuralgia   ? Osteopenia 10/2018  ? T score -1.8 FRAX 5.8% / 1.4%  ?  ?Family History  ?Problem Relation Age of Onset  ?  Heart disease Mother   ? Hypertension Mother   ? Diabetes Father   ? Heart disease Father   ? Diabetes Sister   ? Heart disease Sister   ? Hypertension Sister   ? Breast cancer Sister 67  ? Cancer Sister   ?     Lung  ? Diabetes Brother   ? Thyroid disease Daughter   ? Migraines Daughter   ? Cancer Maternal Grandmother   ?     breast   ?  ?Past Surgical History:  ?Procedure Laterality Date  ? CATARACT EXTRACTION    ? Bilateral  ? FOOT SURGERY Bilateral   ? TUBAL LIGATION    ? ?Social History  ? ?Occupational History  ? Not on file  ?Tobacco Use  ? Smoking status: Former  ? Smokeless tobacco: Never   ?Vaping Use  ? Vaping Use: Never used  ?Substance and Sexual Activity  ? Alcohol use: Yes  ?  Alcohol/week: 7.0 standard drinks  ?  Types: 7 Glasses of wine per week  ? Drug use: No  ? Sexual activity: Not Currently  ?  Partners: Male  ?  Comment: 1st intercourse 81 yo-More than 5 partners  ? ? ? ? ? ?

## 2021-03-14 ENCOUNTER — Ambulatory Visit: Payer: Medicare PPO | Admitting: Family

## 2021-09-16 ENCOUNTER — Ambulatory Visit: Payer: Self-pay

## 2021-09-16 ENCOUNTER — Ambulatory Visit: Payer: Medicare PPO | Admitting: Family

## 2021-09-16 ENCOUNTER — Ambulatory Visit (INDEPENDENT_AMBULATORY_CARE_PROVIDER_SITE_OTHER): Payer: Medicare PPO

## 2021-09-16 DIAGNOSIS — M170 Bilateral primary osteoarthritis of knee: Secondary | ICD-10-CM

## 2021-09-16 DIAGNOSIS — M1711 Unilateral primary osteoarthritis, right knee: Secondary | ICD-10-CM

## 2021-09-16 DIAGNOSIS — M1712 Unilateral primary osteoarthritis, left knee: Secondary | ICD-10-CM

## 2021-09-19 ENCOUNTER — Encounter: Payer: Self-pay | Admitting: Family

## 2021-09-19 NOTE — Progress Notes (Signed)
Office Visit Note   Patient: Miranda Gonzalez           Date of Birth: 1940-04-04           MRN: [id number] Visit Date: 09/16/2021              Requested by: Dixie Dials, MD [address] [place],  [place] [address] PCP: Dixie Dials, MD  Chief Complaint  Patient presents with   Right Knee - Pain   Left Knee - Pain      HPI: The patient is an 81 year old woman who presents today in follow-up.  She has had worsening of her gait worsening of her bilateral knee pain left worse than right.  She notes malalignment of the left knee.  She is to her currently been taking collagen supplements and is hopeful that this has helped her osteoarthritis.  She has called in a few days ago and is currently awaiting approval of her supplemental injections  Is greater than 6 months out from her last supplemental injections.  She was pleased with these.  Assessment & Plan: Visit Diagnoses:  1. Bilateral primary osteoarthritis of knee     Plan: We will proceed with supplemental injection once approved.  She will get a call we will see her back in the office for the injections.  Discussed osteoarthritis at length discussed option of total knee arthroplasty.  Patient feels she is getting by right now and would like to continue to hold off.  Follow-Up Instructions: Return if symptoms worsen or fail to improve.   Ortho Exam  Patient is alert, oriented, no adenopathy, well-dressed, normal affect, normal respiratory effort. On examination of the bilateral knees there is valgus alignment of the left knee.  She has tenderness to the medial and lateral joint lines there is crepitation no effusion  Imaging: No results found. No images are attached to the encounter.  Labs: No results found for: "HGBA1C", "ESRSEDRATE", "CRP", "LABURIC", "REPTSTATUS", "GRAMSTAIN", "CULT", "LABORGA"   No results found for: "ALBUMIN", "PREALBUMIN", "CBC"  No results found for: "MG" No results found for:  "VD25OH"  No results found for: "PREALBUMIN"     No data to display           There is no height or weight on file to calculate BMI.  Orders:  Orders Placed This Encounter  Procedures   XR Knee 1-2 Views Left   XR Knee 1-2 Views Right   No orders of the defined types were placed in this encounter.    Procedures: No procedures performed  Clinical Data: No additional findings.  ROS:  All other systems negative, except as noted in the HPI. Review of Systems  Objective: Vital Signs: There were no vitals taken for this visit.  Specialty Comments:  No specialty comments available.  PMFS History: Patient Active Problem List   Diagnosis Date Noted   Bilateral primary osteoarthritis of knee 08/16/2019   Mastodynia, female 06/06/2015   External hemorrhoid 11/30/2012   Fibroids 11/28/2012   Vaginal atrophy 11/28/2012   H/O vitamin D deficiency 11/28/2012   Osteopenia 11/28/2012   Menopause 11/28/2012   Past Medical History:  Diagnosis Date   Arthritis    Fibroid uterus    Hypertension    Occipital neuralgia    Osteopenia 10/2018   T score -1.8 FRAX 5.8% / 1.4%    Family History  Problem Relation Age of Onset   Heart disease Mother    Hypertension Mother  Diabetes Father    Heart disease Father    Diabetes Sister    Heart disease Sister    Hypertension Sister    Breast cancer Sister 65   Cancer Sister        Lung   Diabetes Brother    Thyroid disease Daughter    Migraines Daughter    Cancer Maternal Grandmother        breast     Past Surgical History:  Procedure Laterality Date   CATARACT EXTRACTION     Bilateral   FOOT SURGERY Bilateral    TUBAL LIGATION     Social History   Occupational History   Not on file  Tobacco Use   Smoking status: Former   Smokeless tobacco: Never  Vaping Use   Vaping Use: Never used  Substance and Sexual Activity   Alcohol use: Yes    Alcohol/week: 7.0 standard drinks of alcohol    Types: 7 Glasses of  wine per week   Drug use: No   Sexual activity: Not Currently    Partners: Male    Comment: 1st intercourse 81 yo-More than 5 partners

## 2021-10-24 ENCOUNTER — Other Ambulatory Visit: Payer: Self-pay | Admitting: Cardiovascular Disease

## 2021-10-24 DIAGNOSIS — Z1231 Encounter for screening mammogram for malignant neoplasm of breast: Secondary | ICD-10-CM

## 2021-10-31 ENCOUNTER — Telehealth: Payer: Self-pay | Admitting: Orthopedic Surgery

## 2021-10-31 NOTE — Telephone Encounter (Signed)
Patient called in requesting Gel injection

## 2021-11-03 NOTE — Telephone Encounter (Signed)
VOB submitted for Monovisc, bilateral knee  

## 2021-11-24 ENCOUNTER — Other Ambulatory Visit: Payer: Self-pay

## 2021-11-24 ENCOUNTER — Telehealth: Payer: Self-pay | Admitting: Orthopedic Surgery

## 2021-11-24 DIAGNOSIS — M170 Bilateral primary osteoarthritis of knee: Secondary | ICD-10-CM

## 2021-11-24 NOTE — Telephone Encounter (Signed)
Pt called requesting a call back with an update about gel injections. Please call pt [phone]

## 2021-11-24 NOTE — Telephone Encounter (Signed)
Talked with patient and appointment has been scheduled for gel injection.  

## 2021-12-03 ENCOUNTER — Ambulatory Visit: Payer: Medicare PPO | Admitting: Family

## 2021-12-03 ENCOUNTER — Encounter: Payer: Self-pay | Admitting: Family

## 2021-12-03 DIAGNOSIS — M170 Bilateral primary osteoarthritis of knee: Secondary | ICD-10-CM | POA: Diagnosis not present

## 2021-12-03 MED ORDER — HYALURONAN 88 MG/4ML IX SOSY
88.0000 mg | PREFILLED_SYRINGE | INTRA_ARTICULAR | Status: AC | PRN
  Administered 2021-12-03: 88 mg via INTRA_ARTICULAR

## 2021-12-03 MED ORDER — LIDOCAINE HCL 1 % IJ SOLN
1.0000 mL | INTRAMUSCULAR | Status: AC | PRN
  Administered 2021-12-03: 1 mL

## 2021-12-03 NOTE — Progress Notes (Signed)
Office Visit Note   Patient: Miranda Gonzalez           Date of Birth: [birth year]           MRN: [id number] Visit Date: 12/03/2021              Requested by: Dixie Dials, MD [address] [place],  [place] [address] PCP: Dixie Dials, MD  Chief Complaint  Patient presents with   Right Knee - Follow-up    Bilateral buy and bill monovisc knee injections.    Left Knee - Follow-up      HPI: The patient is an 81 year old woman who presents today for planned Monovisc injection bilateral knees.  Assessment & Plan: Visit Diagnoses:  1. Bilateral primary osteoarthritis of knee     Plan: Monovisc injection bilateral knees.  Patient tolerated well.  Follow-Up Instructions: Return if symptoms worsen or fail to improve.   Ortho Exam  Patient is alert, oriented, no adenopathy, well-dressed, normal affect, normal respiratory effort.   Imaging: No results found. No images are attached to the encounter.  Labs: No results found for: "HGBA1C", "ESRSEDRATE", "CRP", "LABURIC", "REPTSTATUS", "GRAMSTAIN", "CULT", "LABORGA"   No results found for: "ALBUMIN", "PREALBUMIN", "CBC"  No results found for: "MG" No results found for: "VD25OH"  No results found for: "PREALBUMIN"     No data to display           There is no height or weight on file to calculate BMI.  Orders:  No orders of the defined types were placed in this encounter.  No orders of the defined types were placed in this encounter.    Procedures: Large Joint Inj: bilateral knee on 12/03/2021 3:54 PM Indications: pain Details: 18 G 1.5 in needle, anteromedial approach Medications (Right): 1 mL lidocaine 1 %; 88 mg Hyaluronan 88 MG/4ML Medications (Left): 1 mL lidocaine 1 %; 88 mg Hyaluronan 88 MG/4ML Outcome: tolerated well, no immediate complications Consent was given by the patient.      Clinical Data: No additional findings.  ROS:  All other systems negative, except as noted in the  HPI. Review of Systems  Objective: Vital Signs: There were no vitals taken for this visit.  Specialty Comments:  No specialty comments available.  PMFS History: Patient Active Problem List   Diagnosis Date Noted   Bilateral primary osteoarthritis of knee 08/16/2019   Mastodynia, female 06/06/2015   External hemorrhoid 11/30/2012   Fibroids 11/28/2012   Vaginal atrophy 11/28/2012   H/O vitamin D deficiency 11/28/2012   Osteopenia 11/28/2012   Menopause 11/28/2012   Past Medical History:  Diagnosis Date   Arthritis    Fibroid uterus    Hypertension    Occipital neuralgia    Osteopenia 10/2018   T score -1.8 FRAX 5.8% / 1.4%    Family History  Problem Relation Age of Onset   Heart disease Mother    Hypertension Mother    Diabetes Father    Heart disease Father    Diabetes Sister    Heart disease Sister    Hypertension Sister    Breast cancer Sister 76   Cancer Sister        Lung   Diabetes Brother    Thyroid disease Daughter    Migraines Daughter    Cancer Maternal Grandmother        breast     Past Surgical History:  Procedure Laterality Date   CATARACT EXTRACTION     Bilateral  FOOT SURGERY Bilateral    TUBAL LIGATION     Social History   Occupational History   Not on file  Tobacco Use   Smoking status: Former   Smokeless tobacco: Never  Vaping Use   Vaping Use: Never used  Substance and Sexual Activity   Alcohol use: Yes    Alcohol/week: 7.0 standard drinks of alcohol    Types: 7 Glasses of wine per week   Drug use: No   Sexual activity: Not Currently    Partners: Male    Comment: 1st intercourse 81 yo-More than 5 partners

## 2021-12-16 ENCOUNTER — Ambulatory Visit: Payer: Medicare PPO

## 2021-12-18 ENCOUNTER — Ambulatory Visit
Admission: RE | Admit: 2021-12-18 | Discharge: 2021-12-18 | Disposition: A | Discharge status: Home / Self Care | Payer: Medicare PPO | Source: Ambulatory Visit | Attending: Cardiovascular Disease | Admitting: Cardiovascular Disease

## 2021-12-18 DIAGNOSIS — Z1231 Encounter for screening mammogram for malignant neoplasm of breast: Secondary | ICD-10-CM

## 2022-06-09 ENCOUNTER — Other Ambulatory Visit: Payer: Self-pay

## 2022-06-09 ENCOUNTER — Encounter: Payer: Self-pay | Admitting: Emergency Medicine

## 2022-06-09 ENCOUNTER — Ambulatory Visit
Admission: EM | Admit: 2022-06-09 | Discharge: 2022-06-09 | Disposition: A | Discharge status: Home / Self Care | Payer: Medicare PPO | Attending: Family Medicine | Admitting: Family Medicine

## 2022-06-09 DIAGNOSIS — K115 Sialolithiasis: Secondary | ICD-10-CM

## 2022-06-09 MED ORDER — AMOXICILLIN-POT CLAVULANATE 875-125 MG PO TABS: 1.0000 | ORAL_TABLET | Freq: Two times a day (BID) | ORAL | 0 refills | Status: AC

## 2022-06-09 NOTE — ED Provider Notes (Signed)
[facility]    CSN: [id number] Arrival date & time: 06/09/22  1448      History   Chief Complaint Chief Complaint  Patient presents with   Lymphadenopathy    HPI Miranda Gonzalez is a 82 y.o. female.   HPI Here for some swelling in the floor of her mouth and under her left jaw.  She first noted it yesterday she was beginning to eat her supper.  She felt some puffiness on the left side of the floor of her mouth and under her left jaw.  Overnight it improved and then as she took her first bite of her breakfast this morning she noticed the swelling again.  No fever or chills   She has had just a little discomfort Past Medical History:  Diagnosis Date   Arthritis    Fibroid uterus    Hypertension    Occipital neuralgia    Osteopenia 10/2018   T score -1.8 FRAX 5.8% / 1.4%    Patient Active Problem List   Diagnosis Date Noted   Bilateral primary osteoarthritis of knee 08/16/2019   Mastodynia, female 06/06/2015   External hemorrhoid 11/30/2012   Fibroids 11/28/2012   Vaginal atrophy 11/28/2012   H/O vitamin D deficiency 11/28/2012   Osteopenia 11/28/2012   Menopause 11/28/2012    Past Surgical History:  Procedure Laterality Date   CATARACT EXTRACTION     Bilateral   FOOT SURGERY Bilateral    TUBAL LIGATION      OB History     Gravida  4   Para  3   Term      Preterm      AB  1   Living  3      SAB  1   IAB      Ectopic      Multiple      Live Births               Home Medications    Prior to Admission medications   Medication Sig Start Date End Date Taking? Authorizing Provider  amoxicillin-clavulanate (AUGMENTIN) 875-125 MG tablet Take 1 tablet by mouth 2 (two) times daily for 5 days. 06/09/22 06/14/22 Yes Jontay Maston, Janace Aris, MD  ACCU-CHEK AVIVA PLUS test strip See admin instructions. 01/09/14   [provider]  amLODipine (NORVASC) 5 MG tablet Take 5 mg by mouth daily.    [provider]  Ascorbic Acid  (VITAMIN C PO) Take by mouth.    [provider]  atenolol (TENORMIN) 50 MG tablet Take 50 mg by mouth 2 (two) times daily.    [provider]  atorvastatin (LIPITOR) 40 MG tablet atorvastatin 40 mg tablet  TAKE 1 TABLET BY MOUTH EVERY DAY    [provider]  hydrochlorothiazide (HYDRODIURIL) 25 MG tablet Take 25 mg by mouth daily.    [provider]  MAGNESIUM PO Take by mouth.    [provider]  Potassium 75 MG TABS Take by mouth.    [provider]    Family History Family History  Problem Relation Age of Onset   Heart disease Mother    Hypertension Mother    Diabetes Father    Heart disease Father    Diabetes Sister    Heart disease Sister    Hypertension Sister    Breast cancer Sister 72   Cancer Sister        Lung   Thyroid disease Daughter    Migraines Daughter  Breast cancer Maternal Grandmother    Cancer Maternal Grandmother        breast    Diabetes Brother     Social History Social History   Tobacco Use   Smoking status: Former   Smokeless tobacco: Never  Building services engineer Use: Never used  Substance Use Topics   Alcohol use: Yes    Alcohol/week: 7.0 standard drinks of alcohol    Types: 7 Glasses of wine per week   Drug use: No     Allergies   Grapeseed extract [nutritional supplements], Phenergan [promethazine hcl], Egg-derived products, and Promethazine   Review of Systems Review of Systems   Physical Exam Triage Vital Signs ED Triage Vitals  Enc Vitals Group     BP 06/09/22 1506 131/68     Pulse Rate 06/09/22 1506 75     Resp 06/09/22 1506 18     Temp 06/09/22 1506 98.5 F (36.9 C)     Temp Source 06/09/22 1506 Oral     SpO2 06/09/22 1506 94 %     Weight --      Height --      Head Circumference --      Peak Flow --      Pain Score 06/09/22 1507 3     Pain Loc --      Pain Edu? --      Excl. in GC? --    No data found.  Updated Vital Signs BP 131/68 (BP Location: Left  Arm)   Pulse 75   Temp 98.5 F (36.9 C) (Oral)   Resp 18   SpO2 94%   Visual Acuity Right Eye Distance:   Left Eye Distance:   Bilateral Distance:    Right Eye Near:   Left Eye Near:    Bilateral Near:     Physical Exam Vitals reviewed.  Constitutional:      General: She is not in acute distress.    Appearance: She is not ill-appearing, toxic-appearing or diaphoretic.  HENT:     Nose: Nose normal.     Mouth/Throat:     Mouth: Mucous membranes are moist.     Pharynx: No oropharyngeal exudate or posterior oropharyngeal erythema.     Comments: Currently there is no swelling in the mouth. Eyes:     Extraocular Movements: Extraocular movements intact.     Conjunctiva/sclera: Conjunctivae normal.     Pupils: Pupils are equal, round, and reactive to light.  Neck:     Comments: No lymphadenopathy, except there is a little swelling that is mildly tender just under the left jaw.  There is no erythema or warmth or fluctuance Musculoskeletal:     Cervical back: Neck supple.  Skin:    Coloration: Skin is not jaundiced or pale.  Neurological:     General: No focal deficit present.     Mental Status: She is alert and oriented to person, place, and time.      UC Treatments / Results  Labs (all labs ordered are listed, but only abnormal results are displayed) Labs Reviewed - No data to display  EKG   Radiology No results found.  Procedures Procedures (including critical care time)  Medications Ordered in UC Medications - No data to display  Initial Impression / Assessment and Plan / UC Course  I have reviewed the triage vital signs and the nursing notes.  Pertinent labs & imaging results that were available during my care of the patient were reviewed by  me and considered in my medical decision making (see chart for details).        Discussed with her that this seems to be most likely a salivary gland stone causing intermittent obstruction of the salivary gland  ducts.  In case there is some infection, Augmentin is sent in for 5 days.  Of asked her to follow-up with your primary care Final Clinical Impressions(s) / UC Diagnoses   Final diagnoses:  Salivary gland calculus     Discharge Instructions      Take amoxicillin-clavulanate 875 mg--1 tab twice daily with food for 5 days   Please follow-up with your primary care about this issue       ED Prescriptions     Medication Sig Dispense Auth. Provider   amoxicillin-clavulanate (AUGMENTIN) 875-125 MG tablet Take 1 tablet by mouth 2 (two) times daily for 5 days. 10 tablet Marlinda Mike Janace Aris, MD      I have reviewed the PDMP during this encounter.   Zenia Resides, MD 06/09/22 [phone]

## 2022-06-09 NOTE — Discharge Instructions (Signed)
Take amoxicillin-clavulanate 875 mg--1 tab twice daily with food for 5 days   Please follow-up with your primary care about this issue

## 2022-06-09 NOTE — ED Triage Notes (Signed)
Pt here for pain and swelling on left side of jaw starting last night; pt sts worse when eating

## 2022-07-29 ENCOUNTER — Telehealth: Payer: Self-pay | Admitting: Orthopedic Surgery

## 2022-07-29 NOTE — Telephone Encounter (Signed)
Patient called would like gel injections in her knees. Her cb# [phone]

## 2022-07-30 NOTE — Telephone Encounter (Signed)
VOB submitted for Monovisc, bilateral knee  

## 2022-09-10 ENCOUNTER — Other Ambulatory Visit: Payer: Self-pay

## 2022-09-10 ENCOUNTER — Telehealth: Payer: Self-pay | Admitting: Orthopedic Surgery

## 2022-09-10 DIAGNOSIS — M170 Bilateral primary osteoarthritis of knee: Secondary | ICD-10-CM

## 2022-09-10 NOTE — Telephone Encounter (Signed)
Talked with patient and appointment has been scheduled for gel injection.  

## 2022-09-10 NOTE — Telephone Encounter (Signed)
Patient called asked if the gel injection was approved by her insurance company? Patient asked if she could get a call back as soon as possible. The number to contact patient is [phone]

## 2022-09-23 ENCOUNTER — Ambulatory Visit: Payer: Medicare PPO | Admitting: Family

## 2022-09-23 DIAGNOSIS — M170 Bilateral primary osteoarthritis of knee: Secondary | ICD-10-CM

## 2022-09-25 ENCOUNTER — Encounter: Payer: Self-pay | Admitting: Family

## 2022-09-25 DIAGNOSIS — M170 Bilateral primary osteoarthritis of knee: Secondary | ICD-10-CM | POA: Diagnosis not present

## 2022-09-25 MED ORDER — HYALURONAN 88 MG/4ML IX SOSY
88.0000 mg | PREFILLED_SYRINGE | INTRA_ARTICULAR | Status: AC | PRN
  Administered 2022-09-25: 88 mg via INTRA_ARTICULAR

## 2022-09-25 MED ORDER — LIDOCAINE HCL 1 % IJ SOLN
1.0000 mL | INTRAMUSCULAR | Status: AC | PRN
  Administered 2022-09-25: 1 mL

## 2022-09-25 NOTE — Progress Notes (Signed)
Office Visit Note   Patient: ALAJIAH VISELLI           Date of Birth: 11-Jun-1940           MRN: [id number] Visit Date: 09/23/2022              Requested by: Orpah Cobb, MD [address] [place],  [place] [address] PCP: Orpah Cobb, MD  Chief Complaint  Patient presents with   Left Knee - Follow-up    Pt s/p bilateral monovis buy and bill 11/2021   Right Knee - Follow-up      HPI: The patient is an 82 year old woman who presents today for planned Monovisc injection bilateral knees for her bilateral osteoarthritis  Assessment & Plan: Visit Diagnoses: No diagnosis found.  Plan: Monovisc injection x 2.  Patient tolerated well.  She will follow-up as needed.  Follow-Up Instructions: No follow-ups on file.   Right Knee Exam   Tenderness  The patient is experiencing tenderness in the medial joint line.  Range of Motion  The patient has normal right knee ROM.  Tests  Varus: negative Valgus: negative  Other  Erythema: absent Swelling: moderate Effusion: effusion present   Left Knee Exam   Tenderness  The patient is experiencing tenderness in the medial joint line.  Range of Motion  The patient has normal left knee ROM.  Tests  Varus: negative Valgus: negative  Other  Erythema: absent Swelling: moderate Effusion: effusion present      Patient is alert, oriented, no adenopathy, well-dressed, normal affect, normal respiratory effort.   Imaging: No results found. No images are attached to the encounter.  Labs: No results found for: "HGBA1C", "ESRSEDRATE", "CRP", "LABURIC", "REPTSTATUS", "GRAMSTAIN", "CULT", "LABORGA"   No results found for: "ALBUMIN", "PREALBUMIN", "CBC"  No results found for: "MG" No results found for: "VD25OH"  No results found for: "PREALBUMIN"     No data to display           There is no height or weight on file to calculate BMI.  Orders:  No orders of the defined types were placed in this encounter.  No  orders of the defined types were placed in this encounter.    Procedures: Large Joint Inj: bilateral knee on 09/25/2022 8:54 AM Indications: pain Details: 18 G 1.5 in needle, anteromedial approach Medications (Right): 1 mL lidocaine 1 %; 88 mg Hyaluronan 88 MG/4ML Medications (Left): 1 mL lidocaine 1 %; 88 mg Hyaluronan 88 MG/4ML Consent was given by the patient.     Clinical Data: No additional findings.  ROS:  All other systems negative, except as noted in the HPI. Review of Systems  Constitutional: Negative.   Musculoskeletal:  Positive for arthralgias and joint swelling.    Objective: Vital Signs: There were no vitals taken for this visit.  Specialty Comments:  No specialty comments available.  PMFS History: Patient Active Problem List   Diagnosis Date Noted   Bilateral primary osteoarthritis of knee 08/16/2019   Mastodynia, female 06/06/2015   External hemorrhoid 11/30/2012   Fibroids 11/28/2012   Vaginal atrophy 11/28/2012   H/O vitamin D deficiency 11/28/2012   Osteopenia 11/28/2012   Menopause 11/28/2012   Past Medical History:  Diagnosis Date   Arthritis    Fibroid uterus    Hypertension    Occipital neuralgia    Osteopenia 10/2018   T score -1.8 FRAX 5.8% / 1.4%    Family History  Problem Relation Age of Onset   Heart disease Mother  Hypertension Mother    Diabetes Father    Heart disease Father    Diabetes Sister    Heart disease Sister    Hypertension Sister    Breast cancer Sister 36   Cancer Sister        Lung   Thyroid disease Daughter    Migraines Daughter    Breast cancer Maternal Grandmother    Cancer Maternal Grandmother        breast    Diabetes Brother     Past Surgical History:  Procedure Laterality Date   CATARACT EXTRACTION     Bilateral   FOOT SURGERY Bilateral    TUBAL LIGATION     Social History   Occupational History   Not on file  Tobacco Use   Smoking status: Former   Smokeless tobacco: Never  Vaping  Use   Vaping status: Never Used  Substance and Sexual Activity   Alcohol use: Yes    Alcohol/week: 7.0 standard drinks of alcohol    Types: 7 Glasses of wine per week   Drug use: No   Sexual activity: Not Currently    Partners: Male    Comment: 1st intercourse 82 yo-More than 5 partners

## 2022-12-23 ENCOUNTER — Encounter: Payer: Self-pay | Admitting: Family

## 2022-12-23 ENCOUNTER — Ambulatory Visit: Payer: Medicare PPO | Admitting: Family

## 2022-12-23 DIAGNOSIS — M170 Bilateral primary osteoarthritis of knee: Secondary | ICD-10-CM

## 2022-12-23 MED ORDER — METHYLPREDNISOLONE ACETATE 40 MG/ML IJ SUSP
40.0000 mg | INTRAMUSCULAR | Status: AC | PRN
  Administered 2022-12-23: 40 mg via INTRA_ARTICULAR

## 2022-12-23 MED ORDER — LIDOCAINE HCL 1 % IJ SOLN
5.0000 mL | INTRAMUSCULAR | Status: AC | PRN
  Administered 2022-12-23: 5 mL

## 2022-12-23 NOTE — Progress Notes (Signed)
Office Visit Note   Patient: Miranda Gonzalez           Date of Birth: 17-Jan-1940           MRN: [id number] Visit Date: 12/23/2022              Requested by: Orpah Cobb, MD [address] [place],  [place] [address] PCP: Orpah Cobb, MD  Chief Complaint  Patient presents with   Left Knee - Follow-up   Right Knee - Follow-up      HPI: The patient is an 81 year old woman who presents today for worsening of her bilateral knee pain  She has significant osteoarthritis with valgus deformity bilaterally.  Reports was in bed recently when she felt popping and shooting pain in the left knee lateral aspect she felt her kneecap may have displaced briefly.  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  Plan: Depo-Medrol injection bilateral knees.  Patient tolerated well.  Follow-Up Instructions: Return in about 4 weeks (around 01/20/2023), or if symptoms worsen or fail to improve.   Right Knee Exam   Muscle Strength  The patient has normal right knee strength.  Tenderness  The patient is experiencing tenderness in the lateral joint line.  Tests  Varus: negative Valgus: negative  Other  Erythema: absent Swelling: mild   Left Knee Exam   Muscle Strength  The patient has normal left knee strength.  Tenderness  The patient is experiencing tenderness in the lateral joint line.  Tests  Varus: negative Valgus: negative  Other  Swelling: mild      Patient is alert, oriented, no adenopathy, well-dressed, normal affect, normal respiratory effort.   Imaging: No results found. No images are attached to the encounter.  Labs: No results found for: "HGBA1C", "ESRSEDRATE", "CRP", "LABURIC", "REPTSTATUS",  "GRAMSTAIN", "CULT", "LABORGA"   No results found for: "ALBUMIN", "PREALBUMIN", "CBC"  No results found for: "MG" No results found for: "VD25OH"  No results found for: "PREALBUMIN"     No data to display           There is no height or weight on file to calculate BMI.  Orders:  No orders of the defined types were placed in this encounter.  No orders of the defined types were placed in this encounter.    Procedures: Large Joint Inj: bilateral knee on 12/23/2022 3:45 PM Indications: pain Details: 18 G 1.5 in needle, anteromedial approach Medications (Right): 5 mL lidocaine 1 %; 40 mg methylPREDNISolone acetate 40 MG/ML Medications (Left): 5 mL lidocaine 1 %; 40 mg methylPREDNISolone acetate 40 MG/ML Consent was given by the patient.      Clinical Data: No additional findings.  ROS:  All other systems negative, except as noted in the HPI. Review of Systems  Objective: Vital Signs: There were no vitals taken for this visit.  Specialty Comments:  No specialty comments available.  PMFS History: Patient Active Problem List   Diagnosis Date Noted   Bilateral primary osteoarthritis of knee 08/16/2019   Mastodynia, female 06/06/2015   External hemorrhoid 11/30/2012   Fibroids 11/28/2012   Vaginal atrophy 11/28/2012   H/O vitamin D deficiency 11/28/2012   Osteopenia 11/28/2012   Menopause 11/28/2012  Past Medical History:  Diagnosis Date   Arthritis    Fibroid uterus    Hypertension    Occipital neuralgia    Osteopenia 10/2018   T score -1.8 FRAX 5.8% / 1.4%    Family History  Problem Relation Age of Onset   Heart disease Mother    Hypertension Mother    Diabetes Father    Heart disease Father    Diabetes Sister    Heart disease Sister    Hypertension Sister    Breast cancer Sister 63   Cancer Sister        Lung   Thyroid disease Daughter    Migraines Daughter    Breast cancer Maternal Grandmother    Cancer Maternal Grandmother         breast    Diabetes Brother     Past Surgical History:  Procedure Laterality Date   CATARACT EXTRACTION     Bilateral   FOOT SURGERY Bilateral    TUBAL LIGATION     Social History   Occupational History   Not on file  Tobacco Use   Smoking status: Former   Smokeless tobacco: Never  Vaping Use   Vaping status: Never Used  Substance and Sexual Activity   Alcohol use: Yes    Alcohol/week: 7.0 standard drinks of alcohol    Types: 7 Glasses of wine per week   Drug use: No   Sexual activity: Not Currently    Partners: Male    Comment: 1st intercourse 82 yo-More than 5 partners

## 2023-04-15 ENCOUNTER — Other Ambulatory Visit: Payer: Self-pay | Admitting: Physician Assistant

## 2023-04-15 DIAGNOSIS — R109 Unspecified abdominal pain: Secondary | ICD-10-CM

## 2023-04-15 NOTE — ED Triage Notes (Addendum)
 Called for triage [phone])- no answer, unable to leave message. Pt not in lobby x 3

## 2023-04-21 ENCOUNTER — Telehealth: Payer: Self-pay

## 2023-04-21 NOTE — Telephone Encounter (Signed)
VOB submitted Monovisc, bilateral knee.

## 2023-04-22 ENCOUNTER — Ambulatory Visit: Admitting: Physician Assistant

## 2023-04-22 ENCOUNTER — Encounter: Payer: Self-pay | Admitting: Physician Assistant

## 2023-04-22 ENCOUNTER — Other Ambulatory Visit

## 2023-04-22 DIAGNOSIS — M1712 Unilateral primary osteoarthritis, left knee: Secondary | ICD-10-CM

## 2023-04-22 DIAGNOSIS — M1711 Unilateral primary osteoarthritis, right knee: Secondary | ICD-10-CM

## 2023-04-22 DIAGNOSIS — M170 Bilateral primary osteoarthritis of knee: Secondary | ICD-10-CM

## 2023-04-22 MED ORDER — METHYLPREDNISOLONE ACETATE 40 MG/ML IJ SUSP
40.0000 mg | INTRAMUSCULAR | Status: AC | PRN
  Administered 2023-04-22: 40 mg via INTRA_ARTICULAR

## 2023-04-22 MED ORDER — LIDOCAINE HCL 1 % IJ SOLN
3.0000 mL | INTRAMUSCULAR | Status: AC | PRN
  Administered 2023-04-22: 3 mL

## 2023-04-22 NOTE — Progress Notes (Signed)
 Office Visit Note   Patient: Miranda Gonzalez           Date of Birth: 06-24-1940           MRN: [id number] Visit Date: 04/22/2023              Requested by: Orpah Cobb, MD [address] [place],  [place] [address] PCP: Orpah Cobb, MD  No chief complaint on file.     HPI: Patient is a pleasant 83 year old woman who is a patient of Erin Zamora's.  She comes in occasionally for steroid injections into her bilateral knees.  Requesting same today.  No new injury she is being authorized for Monovisc injections  Assessment & Plan: Visit Diagnoses: Osteoarthritis bilateral knees  Plan: May follow-up as needed  Follow-Up Instructions: Return if symptoms worsen or fail to improve.   Ortho Exam  Patient is alert, oriented, no adenopathy, well-dressed, normal affect, normal respiratory effort. Bilateral knees valgus clinical alignment no erythema no effusion she is neurovascular intact compartments are soft and nontender  Imaging: No results found. No images are attached to the encounter.  Labs: No results found for: "HGBA1C", "ESRSEDRATE", "CRP", "LABURIC", "REPTSTATUS", "GRAMSTAIN", "CULT", "LABORGA"   No results found for: "ALBUMIN", "PREALBUMIN", "CBC"  No results found for: "MG" No results found for: "VD25OH"  No results found for: "PREALBUMIN"     No data to display           There is no height or weight on file to calculate BMI.  Orders:  No orders of the defined types were placed in this encounter.  No orders of the defined types were placed in this encounter.    Procedures: Large Joint Inj: bilateral knee on 04/22/2023 11:04 AM Indications: pain and diagnostic evaluation Details: 25 G 1.5 in needle, anteromedial approach  Arthrogram: No  Medications (Right): 3 mL lidocaine 1 %; 40 mg methylPREDNISolone acetate 40 MG/ML Medications (Left): 3 mL lidocaine 1 %; 40 mg methylPREDNISolone acetate 40 MG/ML Outcome: tolerated well, no immediate  complications Procedure, treatment alternatives, risks and benefits explained, specific risks discussed. Consent was given by the patient. Immediately prior to procedure a time out was called to verify the correct patient, procedure, equipment, support staff and site/side marked as required. Patient was prepped and draped in the usual sterile fashion.    Clinical Data: No additional findings.  ROS:  All other systems negative, except as noted in the HPI. Review of Systems  Objective: Vital Signs: There were no vitals taken for this visit.  Specialty Comments:  No specialty comments available.  PMFS History: Patient Active Problem List   Diagnosis Date Noted  . Bilateral primary osteoarthritis of knee 08/16/2019  . Mastodynia, female 06/06/2015  . External hemorrhoid 11/30/2012  . Fibroids 11/28/2012  . Vaginal atrophy 11/28/2012  . H/O vitamin D deficiency 11/28/2012  . Osteopenia 11/28/2012  . Menopause 11/28/2012   Past Medical History:  Diagnosis Date  . Arthritis   . Fibroid uterus   . Hypertension   . Occipital neuralgia   . Osteopenia 10/2018   T score -1.8 FRAX 5.8% / 1.4%    Family History  Problem Relation Age of Onset  . Heart disease Mother   . Hypertension Mother   . Diabetes Father   . Heart disease Father   . Diabetes Sister   . Heart disease Sister   . Hypertension Sister   . Breast cancer Sister 30  . Cancer Sister  Lung  . Thyroid disease Daughter   . Migraines Daughter   . Breast cancer Maternal Grandmother   . Cancer Maternal Grandmother        breast   . Diabetes Brother     Past Surgical History:  Procedure Laterality Date  . CATARACT EXTRACTION     Bilateral  . FOOT SURGERY Bilateral   . TUBAL LIGATION     Social History   Occupational History  . Not on file  Tobacco Use  . Smoking status: Former  . Smokeless tobacco: Never  Vaping Use  . Vaping status: Never Used  Substance and Sexual Activity  . Alcohol use: Yes     Alcohol/week: 7.0 standard drinks of alcohol    Types: 7 Glasses of wine per week  . Drug use: No  . Sexual activity: Not Currently    Partners: Male    Comment: 1st intercourse 83 yo-More than 5 partners

## 2023-04-28 ENCOUNTER — Encounter: Admitting: Physical Medicine and Rehabilitation

## 2023-06-28 ENCOUNTER — Telehealth: Payer: Self-pay | Admitting: Orthopedic Surgery

## 2023-06-28 NOTE — Telephone Encounter (Signed)
 Pt has appt for bil knee gel injection 7/3. Please call pt if have co pay

## 2023-06-29 ENCOUNTER — Other Ambulatory Visit: Payer: Self-pay

## 2023-06-29 DIAGNOSIS — M170 Bilateral primary osteoarthritis of knee: Secondary | ICD-10-CM

## 2023-06-29 NOTE — Telephone Encounter (Signed)
 Talked with patient and advised her of co-pay for gel injection.

## 2023-07-08 ENCOUNTER — Ambulatory Visit: Admitting: Orthopedic Surgery

## 2023-07-08 DIAGNOSIS — M170 Bilateral primary osteoarthritis of knee: Secondary | ICD-10-CM

## 2023-07-13 ENCOUNTER — Encounter: Payer: Self-pay | Admitting: Orthopedic Surgery

## 2023-07-13 DIAGNOSIS — M170 Bilateral primary osteoarthritis of knee: Secondary | ICD-10-CM | POA: Diagnosis not present

## 2023-07-13 MED ORDER — HYALURONAN 88 MG/4ML IX SOSY
88.0000 mg | PREFILLED_SYRINGE | INTRA_ARTICULAR | Status: AC | PRN
  Administered 2023-07-13: 88 mg via INTRA_ARTICULAR

## 2023-07-13 NOTE — Progress Notes (Signed)
 Office Visit Note   Patient: Miranda Gonzalez           Date of Birth: 01/18/1940           MRN: [id number] Visit Date: 07/08/2023              Requested by: Claudene Pacific, MD [address] [place],  [place] [address] PCP: Claudene Pacific, MD  Chief Complaint  Patient presents with   Right Knee - Follow-up    Monovisc bilateral buy and bill   Left Knee - Follow-up      HPI: Patient is an 83 year old woman who has had temporary relief with steroid injections in both knees she presents at this time for evaluation for hyaluronic acid injections.  Assessment & Plan: Visit Diagnoses: No diagnosis found.  Plan: Both knees were injected with Monovisc.  Follow-Up Instructions: Return if symptoms worsen or fail to improve.   Ortho Exam  Patient is alert, oriented, no adenopathy, well-dressed, normal affect, normal respiratory effort. Examination patient has mild effusion there is crepitation with range of motion of both knees collaterals and cruciates are stable.    Imaging: No results found. No images are attached to the encounter.  Labs: No results found for: HGBA1C, ESRSEDRATE, CRP, LABURIC, REPTSTATUS, GRAMSTAIN, CULT, LABORGA   No results found for: ALBUMIN, PREALBUMIN, CBC  No results found for: MG No results found for: VD25OH  No results found for: PREALBUMIN     No data to display           There is no height or weight on file to calculate BMI.  Orders:  No orders of the defined types were placed in this encounter.  No orders of the defined types were placed in this encounter.    Procedures: Large Joint Inj: bilateral knee on 07/13/2023 10:42 AM Indications: pain and diagnostic evaluation Details: 22 G 1.5 in needle, anteromedial approach  Arthrogram: No  Medications (Right): 88 mg Hyaluronan 88 MG/4ML Medications (Left): 88 mg Hyaluronan 88 MG/4ML Outcome: tolerated well, no immediate complications Procedure,  treatment alternatives, risks and benefits explained, specific risks discussed. Consent was given by the patient. Immediately prior to procedure a time out was called to verify the correct patient, procedure, equipment, support staff and site/side marked as required. Patient was prepped and draped in the usual sterile fashion.      Clinical Data: No additional findings.  ROS:  All other systems negative, except as noted in the HPI. Review of Systems  Objective: Vital Signs: There were no vitals taken for this visit.  Specialty Comments:  No specialty comments available.  PMFS History: Patient Active Problem List   Diagnosis Date Noted   Bilateral primary osteoarthritis of knee 08/16/2019   Mastodynia, female 06/06/2015   External hemorrhoid 11/30/2012   Fibroids 11/28/2012   Vaginal atrophy 11/28/2012   H/O vitamin D deficiency 11/28/2012   Osteopenia 11/28/2012   Menopause 11/28/2012   Past Medical History:  Diagnosis Date   Arthritis    Fibroid uterus    Hypertension    Occipital neuralgia    Osteopenia 10/2018   T score -1.8 FRAX 5.8% / 1.4%    Family History  Problem Relation Age of Onset   Heart disease Mother    Hypertension Mother    Diabetes Father    Heart disease Father    Diabetes Sister    Heart disease Sister    Hypertension Sister    Breast cancer Sister 52   Cancer  Sister        Lung   Thyroid disease Daughter    Migraines Daughter    Breast cancer Maternal Grandmother    Cancer Maternal Grandmother        breast    Diabetes Brother     Past Surgical History:  Procedure Laterality Date   CATARACT EXTRACTION     Bilateral   FOOT SURGERY Bilateral    TUBAL LIGATION     Social History   Occupational History   Not on file  Tobacco Use   Smoking status: Former   Smokeless tobacco: Never  Vaping Use   Vaping status: Never Used  Substance and Sexual Activity   Alcohol use: Yes    Alcohol/week: 7.0 standard drinks of alcohol     Types: 7 Glasses of wine per week   Drug use: No   Sexual activity: Not Currently    Partners: Male    Comment: 1st intercourse 83 yo-More than 5 partners

## 2023-10-14 ENCOUNTER — Ambulatory Visit: Admitting: Physician Assistant

## 2023-10-14 ENCOUNTER — Other Ambulatory Visit: Payer: Self-pay

## 2023-10-14 DIAGNOSIS — G8929 Other chronic pain: Secondary | ICD-10-CM | POA: Diagnosis not present

## 2023-10-14 DIAGNOSIS — M25561 Pain in right knee: Secondary | ICD-10-CM | POA: Diagnosis not present

## 2023-10-14 DIAGNOSIS — M25562 Pain in left knee: Secondary | ICD-10-CM

## 2023-10-14 DIAGNOSIS — M170 Bilateral primary osteoarthritis of knee: Secondary | ICD-10-CM

## 2023-10-14 NOTE — Progress Notes (Unsigned)
 Office Visit Note   Patient: Miranda Gonzalez           Date of Birth: [birth year]           MRN: [id number] Visit Date: 10/14/2023              Requested by: Claudene Pacific, MD [address] [place],  [place] [address] PCP: Claudene Pacific, MD  Chief Complaint  Patient presents with   Right Knee - Follow-up    S/p bilateral Monovisc injections 07/08/2023   Left Knee - Follow-up      HPI: 83 y/o female with known OA bilateral knees.  She has had injections in the past in both knees.  Most recently she had hyaluronic acid injections.  The last one was July 2025.  She returns today with reports of knee pain the right lateral > than the left.  She occasionally takes Ibuprofen for pain depending on her activity.   She is not interested in TKA.  Assessment & Plan: Visit Diagnoses:  1. Chronic pain of both knees   2. Bilateral primary osteoarthritis of knee     Plan: Bilateral knee injections.  She tolerated this well.    Follow-Up Instructions: Return if symptoms worsen or fail to improve.   Ortho Exam  Patient is alert, oriented, no adenopathy, well-dressed, normal affect, normal respiratory effort. Bilateral valgus knee deformity.  Full extension and flex to 90 degrees.  Patella crepitus B with active flexion and extension.  No fluctuance or erythema.      Imaging: Bone on bone medial joint line with B valgus deformity.  Patellar spurring.  Medial femoral and tibial spurring.    Labs: No results found for: HGBA1C, ESRSEDRATE, CRP, LABURIC, REPTSTATUS, GRAMSTAIN, CULT, LABORGA   No results found for: ALBUMIN, PREALBUMIN, CBC  No results found for: MG No results found for: VD25OH  No results found for: PREALBUMIN     No data to display           There is no height or weight on file to calculate BMI.  Orders:  Orders Placed This Encounter  Procedures   Large Joint Inj   XR Knee 1-2 Views Right   XR Knee 1-2 Views Left   No  orders of the defined types were placed in this encounter.    Procedures: Large Joint Inj: bilateral knee on 10/15/2023 3:48 PM Indications: pain and diagnostic evaluation Details: 22 G 1.5 in needle  Arthrogram: No  Medications (Right): 5 mL lidocaine  (PF) 1 %; 40 mg methylPREDNISolone  acetate 40 MG/ML Medications (Left): 5 mL lidocaine  (PF) 1 %; 40 mg methylPREDNISolone  acetate 40 MG/ML Outcome: tolerated well, no immediate complications Procedure, treatment alternatives, risks and benefits explained, specific risks discussed. Consent was given by the patient. Immediately prior to procedure a time out was called to verify the correct patient, procedure, equipment, support staff and site/side marked as required. Patient was prepped and draped in the usual sterile fashion.      Clinical Data: No additional findings.  ROS:  All other systems negative, except as noted in the HPI. Review of Systems  Objective: Vital Signs: There were no vitals taken for this visit.  Specialty Comments:  No specialty comments available.  PMFS History: Patient Active Problem List   Diagnosis Date Noted   Bilateral primary osteoarthritis of knee 08/16/2019   Mastodynia, female 06/06/2015   External hemorrhoid 11/30/2012   Fibroids 11/28/2012   Vaginal atrophy 11/28/2012   H/O vitamin D  deficiency 11/28/2012   Osteopenia 11/28/2012   Menopause 11/28/2012   Past Medical History:  Diagnosis Date   Arthritis    Fibroid uterus    Hypertension    Occipital neuralgia    Osteopenia 10/2018   T score -1.8 FRAX 5.8% / 1.4%    Family History  Problem Relation Age of Onset   Heart disease Mother    Hypertension Mother    Diabetes Father    Heart disease Father    Diabetes Sister    Heart disease Sister    Hypertension Sister    Breast cancer Sister 60   Cancer Sister        Lung   Thyroid disease Daughter    Migraines Daughter    Breast cancer Maternal Grandmother    Cancer Maternal  Grandmother        breast    Diabetes Brother     Past Surgical History:  Procedure Laterality Date   CATARACT EXTRACTION     Bilateral   FOOT SURGERY Bilateral    TUBAL LIGATION     Social History   Occupational History   Not on file  Tobacco Use   Smoking status: Former   Smokeless tobacco: Never  Vaping Use   Vaping status: Never Used  Substance and Sexual Activity   Alcohol use: Yes    Alcohol/week: 7.0 standard drinks of alcohol    Types: 7 Glasses of wine per week   Drug use: No   Sexual activity: Not Currently    Partners: Male    Comment: 1st intercourse 83 yo-More than 5 partners

## 2023-10-15 ENCOUNTER — Encounter: Payer: Self-pay | Admitting: Physician Assistant

## 2023-10-15 DIAGNOSIS — M170 Bilateral primary osteoarthritis of knee: Secondary | ICD-10-CM

## 2023-10-15 MED ORDER — LIDOCAINE HCL (PF) 1 % IJ SOLN
5.0000 mL | INTRAMUSCULAR | Status: AC | PRN
  Administered 2023-10-15: 5 mL

## 2023-10-15 MED ORDER — METHYLPREDNISOLONE ACETATE 40 MG/ML IJ SUSP
40.0000 mg | INTRAMUSCULAR | Status: AC | PRN
  Administered 2023-10-15: 40 mg via INTRA_ARTICULAR

## 2023-11-08 ENCOUNTER — Encounter: Payer: Self-pay | Admitting: Radiology
